# Patient Record
Sex: Female | Born: 1970 | Race: White | Hispanic: No | Marital: Single | Smoking: Current every day smoker
Health system: Southern US, Community
[De-identification: ages and names within clinical notes are randomized; demographics above are authoritative.]

## PROBLEM LIST (undated history)

## (undated) DIAGNOSIS — R51 Headache: Secondary | ICD-10-CM

## (undated) DIAGNOSIS — F419 Anxiety disorder, unspecified: Secondary | ICD-10-CM

## (undated) DIAGNOSIS — F329 Major depressive disorder, single episode, unspecified: Secondary | ICD-10-CM

## (undated) DIAGNOSIS — F32A Depression, unspecified: Secondary | ICD-10-CM

## (undated) HISTORY — PX: ABDOMINAL HYSTERECTOMY: SHX81

## (undated) HISTORY — PX: HERNIA REPAIR: SHX51

## (undated) HISTORY — DX: Major depressive disorder, single episode, unspecified: F32.9

## (undated) HISTORY — DX: Depression, unspecified: F32.A

## (undated) HISTORY — DX: Anxiety disorder, unspecified: F41.9

## (undated) HISTORY — DX: Headache: R51

---

## 1998-08-02 ENCOUNTER — Other Ambulatory Visit: Admission: RE | Admit: 1998-08-02 | Discharge: 1998-08-02 | Payer: Self-pay | Admitting: Obstetrics and Gynecology

## 1999-11-28 ENCOUNTER — Other Ambulatory Visit: Admission: RE | Admit: 1999-11-28 | Discharge: 1999-11-28 | Payer: Self-pay | Admitting: Gynecology

## 2000-05-19 ENCOUNTER — Encounter: Payer: Self-pay | Admitting: Orthopedic Surgery

## 2000-05-19 ENCOUNTER — Ambulatory Visit (HOSPITAL_COMMUNITY): Admission: RE | Admit: 2000-05-19 | Discharge: 2000-05-19 | Payer: Self-pay | Admitting: Orthopedic Surgery

## 2000-06-16 ENCOUNTER — Encounter: Admission: RE | Admit: 2000-06-16 | Discharge: 2000-06-16 | Payer: Self-pay | Admitting: Orthopedic Surgery

## 2000-06-16 ENCOUNTER — Encounter: Payer: Self-pay | Admitting: Orthopedic Surgery

## 2000-06-23 ENCOUNTER — Encounter: Admission: RE | Admit: 2000-06-23 | Discharge: 2000-06-23 | Payer: Self-pay | Admitting: Obstetrics & Gynecology

## 2000-06-23 ENCOUNTER — Encounter: Payer: Self-pay | Admitting: Obstetrics & Gynecology

## 2001-01-06 ENCOUNTER — Other Ambulatory Visit: Admission: RE | Admit: 2001-01-06 | Discharge: 2001-01-06 | Payer: Self-pay | Admitting: Obstetrics and Gynecology

## 2001-07-07 ENCOUNTER — Other Ambulatory Visit: Admission: RE | Admit: 2001-07-07 | Discharge: 2001-07-07 | Payer: Self-pay | Admitting: Obstetrics and Gynecology

## 2001-09-01 ENCOUNTER — Inpatient Hospital Stay (HOSPITAL_COMMUNITY): Admission: RE | Admit: 2001-09-01 | Discharge: 2001-09-04 | Payer: Self-pay | Admitting: Obstetrics and Gynecology

## 2001-09-18 ENCOUNTER — Encounter: Admission: RE | Admit: 2001-09-18 | Discharge: 2001-09-18 | Payer: Self-pay | Admitting: Surgery

## 2001-09-18 ENCOUNTER — Encounter: Payer: Self-pay | Admitting: Surgery

## 2002-04-09 ENCOUNTER — Encounter: Payer: Self-pay | Admitting: Surgery

## 2002-04-09 ENCOUNTER — Encounter: Admission: RE | Admit: 2002-04-09 | Discharge: 2002-04-09 | Payer: Self-pay | Admitting: Surgery

## 2003-01-07 ENCOUNTER — Other Ambulatory Visit: Admission: RE | Admit: 2003-01-07 | Discharge: 2003-01-07 | Payer: Self-pay | Admitting: Obstetrics and Gynecology

## 2003-01-11 ENCOUNTER — Encounter: Admission: RE | Admit: 2003-01-11 | Discharge: 2003-01-11 | Payer: Self-pay | Admitting: Obstetrics and Gynecology

## 2003-07-31 ENCOUNTER — Emergency Department (HOSPITAL_COMMUNITY): Admission: EM | Admit: 2003-07-31 | Discharge: 2003-07-31 | Payer: Self-pay | Admitting: Emergency Medicine

## 2004-06-04 ENCOUNTER — Emergency Department (HOSPITAL_COMMUNITY): Admission: EM | Admit: 2004-06-04 | Discharge: 2004-06-04 | Payer: Self-pay | Admitting: Family Medicine

## 2005-03-26 ENCOUNTER — Encounter: Admission: RE | Admit: 2005-03-26 | Discharge: 2005-03-26 | Payer: Self-pay | Admitting: Surgery

## 2005-06-06 ENCOUNTER — Ambulatory Visit: Payer: Self-pay | Admitting: Family Medicine

## 2006-04-24 ENCOUNTER — Ambulatory Visit: Payer: Self-pay | Admitting: Family Medicine

## 2006-07-22 ENCOUNTER — Emergency Department (HOSPITAL_COMMUNITY): Admission: EM | Admit: 2006-07-22 | Discharge: 2006-07-22 | Payer: Self-pay | Admitting: Emergency Medicine

## 2006-07-31 ENCOUNTER — Ambulatory Visit: Payer: Self-pay | Admitting: Family Medicine

## 2006-09-18 DIAGNOSIS — R519 Headache, unspecified: Secondary | ICD-10-CM | POA: Insufficient documentation

## 2006-09-18 DIAGNOSIS — F411 Generalized anxiety disorder: Secondary | ICD-10-CM

## 2006-09-18 DIAGNOSIS — R51 Headache: Secondary | ICD-10-CM | POA: Insufficient documentation

## 2007-06-05 ENCOUNTER — Ambulatory Visit: Payer: Self-pay | Admitting: Family Medicine

## 2007-06-05 DIAGNOSIS — R002 Palpitations: Secondary | ICD-10-CM | POA: Insufficient documentation

## 2007-06-08 LAB — CONVERTED CEMR LAB
AST: 16 units/L (ref 0–37)
Alkaline Phosphatase: 64 units/L (ref 39–117)
BUN: 15 mg/dL (ref 6–23)
Basophils Absolute: 0.1 10*3/uL (ref 0.0–0.1)
Chloride: 104 meq/L (ref 96–112)
Eosinophils Absolute: 0.2 10*3/uL (ref 0.0–0.7)
Eosinophils Relative: 4.3 % (ref 0.0–5.0)
GFR calc non Af Amer: 101 mL/min
Lymphocytes Relative: 36.3 % (ref 12.0–46.0)
MCV: 92.4 fL (ref 78.0–100.0)
Neutrophils Relative %: 53.3 % (ref 43.0–77.0)
Platelets: 237 10*3/uL (ref 150–400)
Potassium: 4 meq/L (ref 3.5–5.1)
Total Bilirubin: 0.9 mg/dL (ref 0.3–1.2)
WBC: 5.7 10*3/uL (ref 4.5–10.5)

## 2007-06-19 ENCOUNTER — Encounter: Payer: Self-pay | Admitting: Family Medicine

## 2007-06-19 ENCOUNTER — Ambulatory Visit: Payer: Self-pay

## 2007-08-10 IMAGING — CT CT ABDOMEN W/ CM
1 of 3 series · 14 of 32 positions shown, 19 images · IV contrast (REDICAT/H2O & OMNIPQUE [ID])
Comparison: 04/09/02.

CLINICAL DATA: Abdominal pain.  Question recurrent hernia or tear from prior MESH placement. 
CT ABDOMEN AND PELVIS WITH CONTRAST:
TECHNIQUE: Multidetector CT imaging of the abdomen and pelvis was performed following the standard protocol during bolus administration of intravenous contrast.
Contrast:  566cc Omnipaque 300.

[Series 2: — · axial · 0.70mm/px · z∈[-381,-61]mm · 14 of 74 slices shown, 19 images]
[im 5/74  soft-tissue]
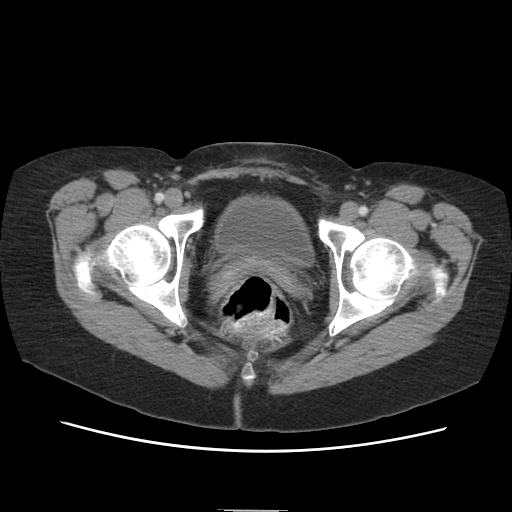
[im 5/74  bone]
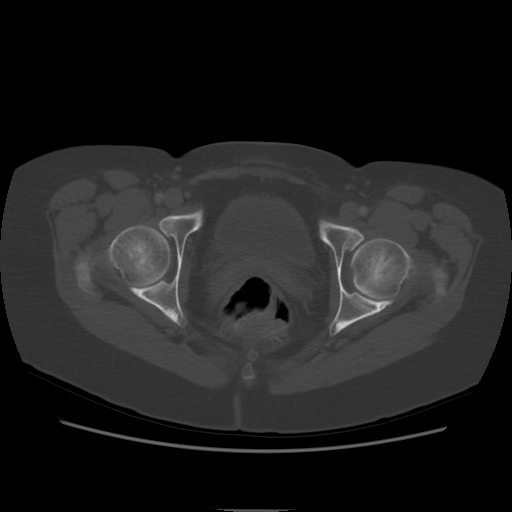
[im 9/74  soft-tissue]
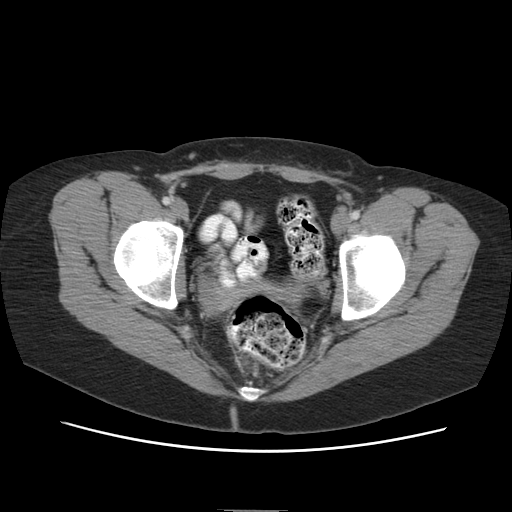
[im 18/74  soft-tissue]
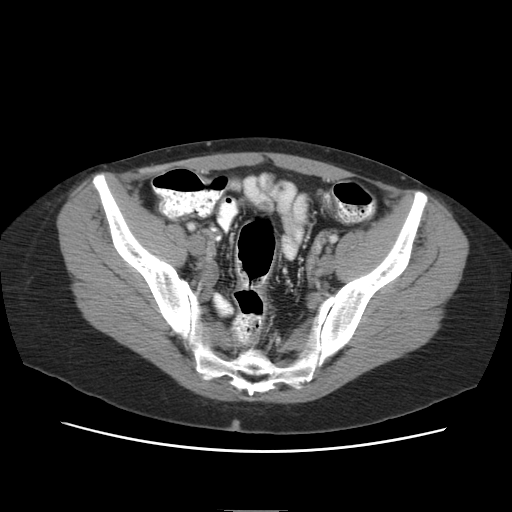
[im 22/74  soft-tissue]
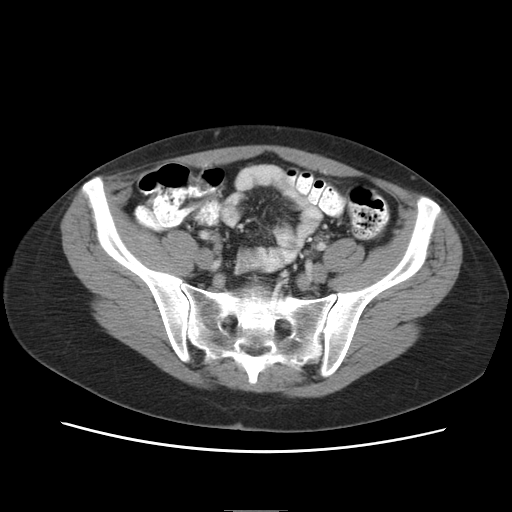
[im 26/74  soft-tissue]
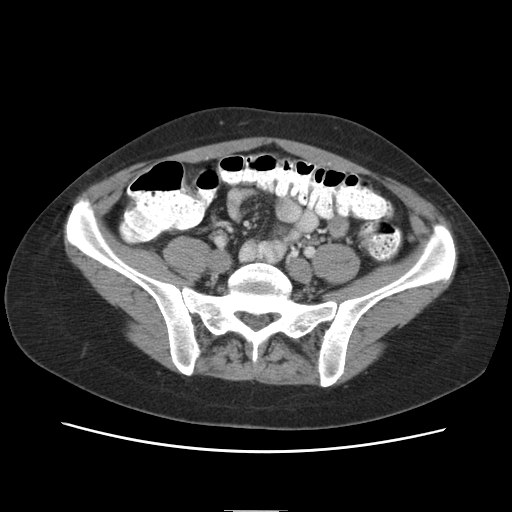
[im 31/74  soft-tissue]
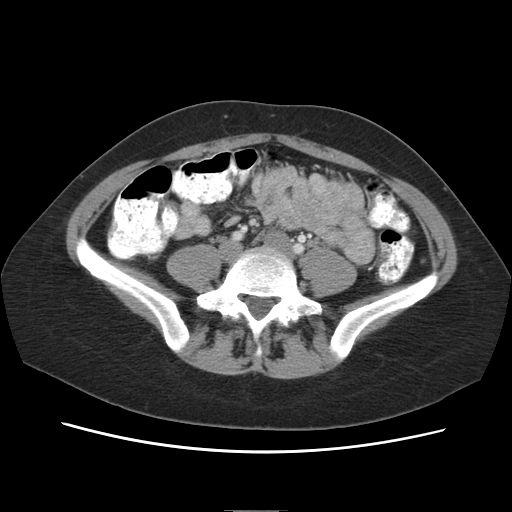
[im 39/74  soft-tissue]
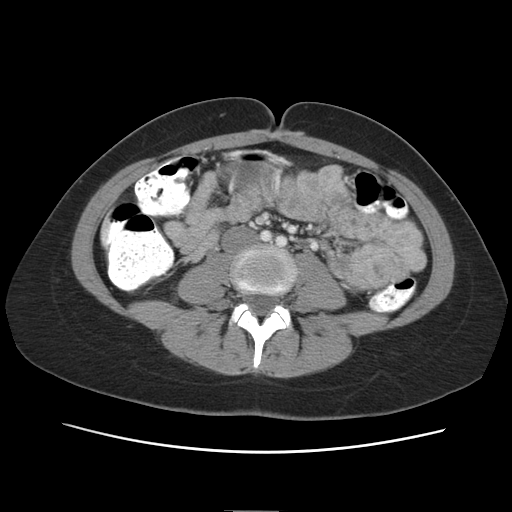
[im 43/74  soft-tissue]
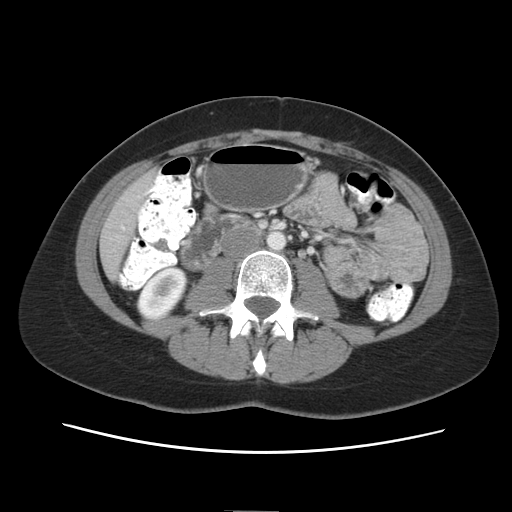
[im 48/74  soft-tissue]
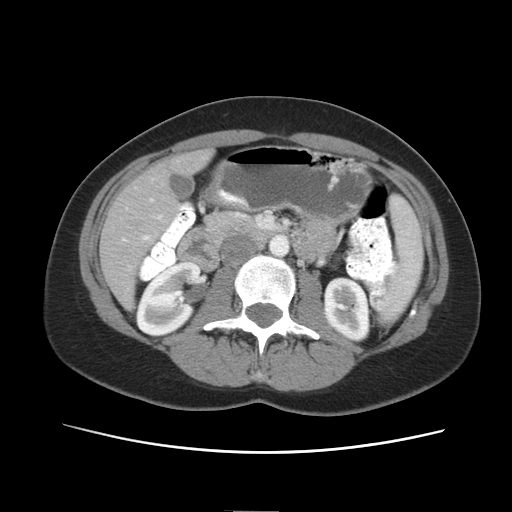
[im 48/74  bone]
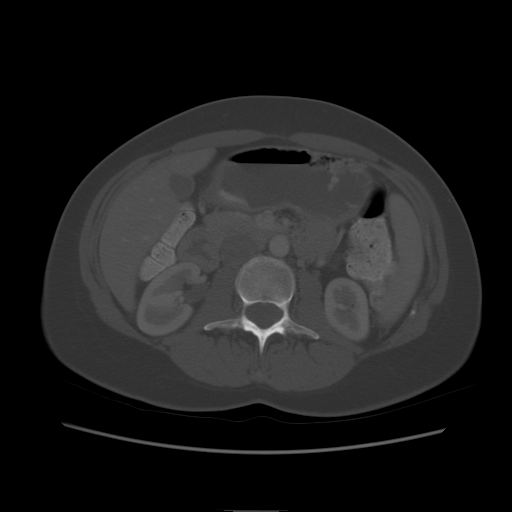
[im 52/74  soft-tissue]
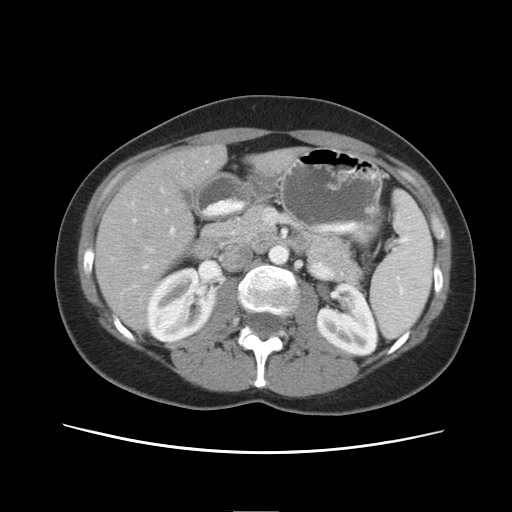
[im 56/74  soft-tissue]
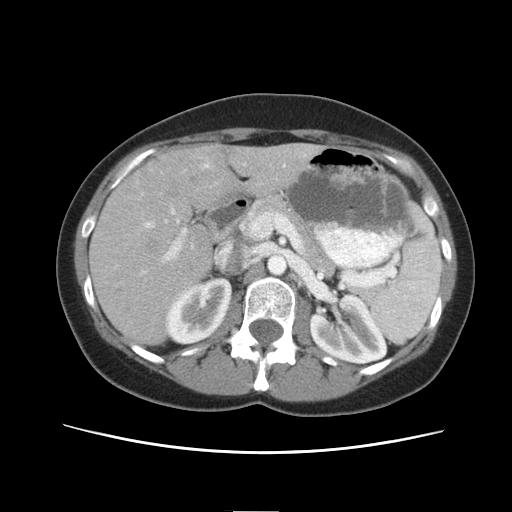
[im 56/74  lung]
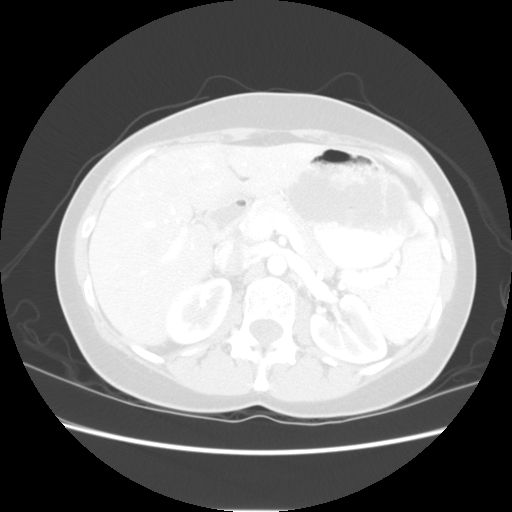
[im 61/74  lung]
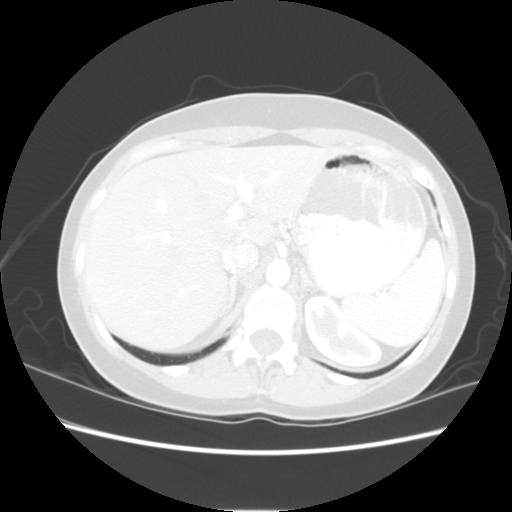
[im 65/74  soft-tissue]
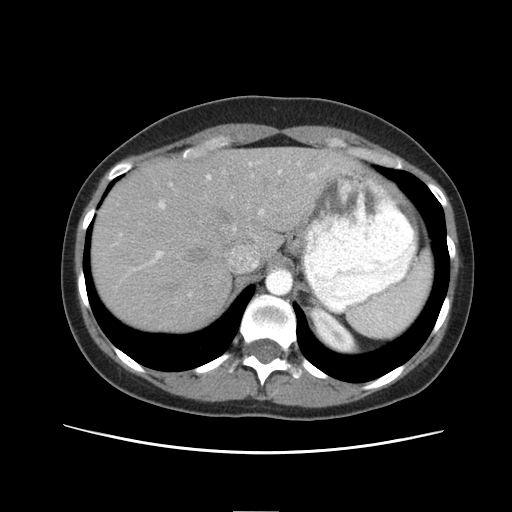
[im 65/74  lung]
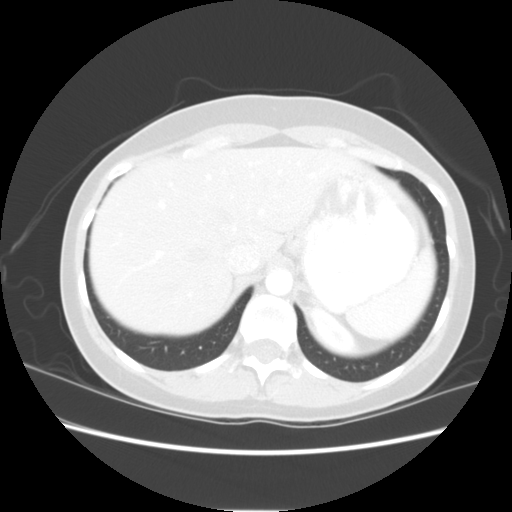
[im 69/74  soft-tissue]
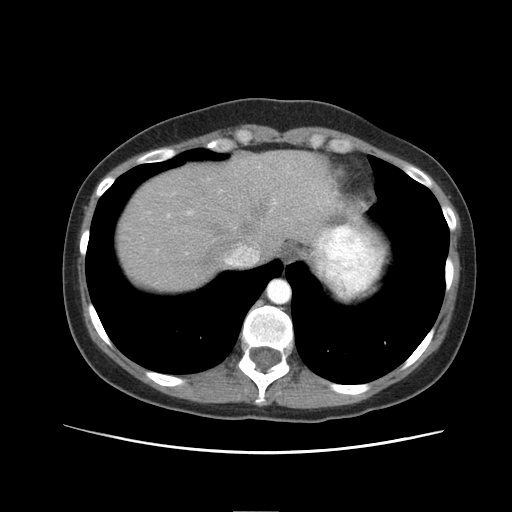
[im 69/74  lung]
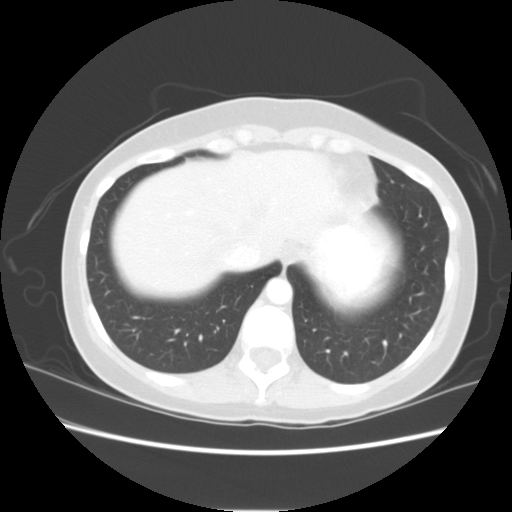

[14 of 32 positions shown; findings below may reference images not displayed]

CT ABDOMEN WITH CONTRAST:
No focal abnormality is seen in the liver or spleen.  The stomach, duodenum, pancreas, gallbladder, adrenal glands, and kidneys have normal features.  No free fluid or adenopathy.  Abdominal bowel loops are unremarkable.
IMPRESSION: No acute findings in the abdomen. 
CT PELVIS WITH CONTRAST:
No evidence for free fluid or lymphadenopathy in the anatomic pelvis.  17mm dominant follicle noted in the right ovary.  Left ovary is also unremarkable.  Patient is status post hysterectomy.  Terminal ileum and appendix have normal features.  
Linear high attenuation in the right inguinal region suggests prior hernia repair.  There is no evidence for recurrent hernia.  There is no adjacent edema or inflammation.  Similarly, there is linear high attenuation at the umbilicus suggesting that the patient may have had a prior umbilical hernia repair as well.  There is no evidence for recurrent fascial defect or hernia at this location.  No adjacent fluid collection or edema.
IMPRESSION: 1.  No acute findings.  There is no evidence for recurrent hernia at the umbilicus or at the level of the right inguinal region.  
2.  No CT evidence to explain this patient?s history of pain.

## 2007-11-05 ENCOUNTER — Ambulatory Visit: Payer: Self-pay | Admitting: Family Medicine

## 2009-04-19 ENCOUNTER — Ambulatory Visit: Payer: Self-pay | Admitting: Family Medicine

## 2009-04-19 DIAGNOSIS — F4321 Adjustment disorder with depressed mood: Secondary | ICD-10-CM | POA: Insufficient documentation

## 2009-05-03 ENCOUNTER — Ambulatory Visit: Payer: Self-pay | Admitting: Family Medicine

## 2009-05-03 DIAGNOSIS — F329 Major depressive disorder, single episode, unspecified: Secondary | ICD-10-CM

## 2009-08-03 ENCOUNTER — Ambulatory Visit: Payer: Self-pay | Admitting: Family Medicine

## 2010-03-06 NOTE — Assessment & Plan Note (Signed)
Summary: pt would like something for her nerves due to death in family...   Vital Signs:  Patient profile:   40 year old female Weight:      149 pounds BP sitting:   110 / 82  (left arm) Cuff size:   regular  Vitals Entered By: Raechel Ache, RN (April 19, 2009 3:57 PM) CC: Having trouble dealing with death of boyfriend- can't eat or sleep.   History of Present Illness: Here for help with depression and anxiety after the death of her boyfriend one week ago. She had met him in [place], and he was planning to move here to be with her. Last week as he was driving there to pack up he was killed in a MVA. She has been very upset ever since, tearful, can't eat or sleep, and has had trouble doing her work at her job. She has never been treated for stress before.   Allergies: 1)  ! Levaquin  Past History:  Past Medical History: Reviewed history from 09/18/2006 and no changes required. Anxiety Headache  Review of Systems  The patient denies anorexia, fever, weight loss, weight gain, vision loss, decreased hearing, hoarseness, chest pain, syncope, dyspnea on exertion, peripheral edema, prolonged cough, headaches, hemoptysis, abdominal pain, melena, hematochezia, severe indigestion/heartburn, hematuria, incontinence, genital sores, muscle weakness, suspicious skin lesions, transient blindness, difficulty walking, unusual weight change, abnormal bleeding, enlarged lymph nodes, angioedema, breast masses, and testicular masses.    Physical Exam  General:  Well-developed,well-nourished,in no acute distress; alert,appropriate and cooperative throughout examination Psych:  Oriented X3, memory intact for recent and remote, normally interactive, good eye contact, depressed affect, and tearful.     Impression & Recommendations:  Problem # 1:  DEPRESSION, SITUATIONAL (ICD-309.0)  Complete Medication List: 1)  Ibuprofen 600 Mg Tabs (Ibuprofen) .Marland Kitchen.. 1 by mouth once daily as needed 2)   Alprazolam 0.5 Mg Tabs (Alprazolam) .Marland Kitchen.. 1 6 hours as needed anxiety 3)  Celexa 20 Mg Tabs (Citalopram hydrobromide) .... Once daily  Patient Instructions: 1)  We spent 30 minutes discussing this situation and how to help her deal with it. Start on Celexa and Xanax. I offered to set her up with a therapist but she wants to think about this first. 2)  Please schedule a follow-up appointment in 2 weeks.  Prescriptions: CELEXA 20 MG TABS (CITALOPRAM HYDROBROMIDE) once daily  #30 x 2   Entered and Authorized by:   Nelwyn Salisbury MD   Signed by:   Nelwyn Salisbury MD on 04/19/2009   Method used:   Print then Give to Patient   RxID:   [id number] ALPRAZOLAM 0.5 MG TABS (ALPRAZOLAM) 1 6 hours as needed anxiety  #60 x 2   Entered and Authorized by:   Nelwyn Salisbury MD   Signed by:   Nelwyn Salisbury MD on 04/19/2009   Method used:   Print then Give to Patient   RxID:   [id number]

## 2010-03-06 NOTE — Assessment & Plan Note (Signed)
Summary: med check/refill/pt coming in fasting/? inj needed for work/p...   Vital Signs:  Patient profile:   40 year old female Weight:      143 pounds BP sitting:   110 / 72  (left arm) Cuff size:   regular  Vitals Entered By: Raechel Ache, RN (August 03, 2009 10:02 AM) CC: Med check and dog bite Sunday R leg- needs Tetanus.   History of Present Illness: Here to follow up on depression and to check a dog bite. Her depression has been well controlled, and she is very pleased with how Celexa is helping her. She sleeps well, is happy at work, and is doing well in general. However 4 days ago she stopped to help a dog that had been struck by a car driving ahead of her, and the dog bit her on the right lower leg. He did have a collar and appeared to be up to date on his shots. She has been cleaning the bite daily and wrapping it. The area is still fairly sore and drains a clear fluid. No fever or streaking.   Allergies: 1)  ! Levaquin  Past History:  Past Medical History: Reviewed history from 05/03/2009 and no changes required. Anxiety Headache Depression  Review of Systems  The patient denies anorexia, fever, weight loss, weight gain, vision loss, decreased hearing, hoarseness, chest pain, syncope, dyspnea on exertion, peripheral edema, prolonged cough, headaches, hemoptysis, abdominal pain, melena, hematochezia, severe indigestion/heartburn, hematuria, incontinence, genital sores, muscle weakness, suspicious skin lesions, transient blindness, difficulty walking, unusual weight change, abnormal bleeding, enlarged lymph nodes, angioedema, breast masses, and testicular masses.    Physical Exam  General:  Well-developed,well-nourished,in no acute distress; alert,appropriate and cooperative throughout examination Extremities:  the right lower leg has several small puncture wounds surrounded ny a small zone of erythema and ecchymosis. It is not draining, and it is mildly tender.  Psych:   Cognition and judgment appear intact. Alert and cooperative with normal attention span and concentration. No apparent delusions, illusions, hallucinations   Impression & Recommendations:  Problem # 1:  DEPRESSION (ICD-311)  Her updated medication list for this problem includes:    Alprazolam 0.5 Mg Tabs (Alprazolam) .Marland Kitchen... 1 6 hours as needed anxiety    Celexa 40 Mg Tabs (Citalopram hydrobromide) ..... Once daily  Problem # 2:  DOG BITE (ICD-E906.0)  Complete Medication List: 1)  Ibuprofen 600 Mg Tabs (Ibuprofen) .Marland Kitchen.. 1 by mouth once daily as needed 2)  Alprazolam 0.5 Mg Tabs (Alprazolam) .Marland Kitchen.. 1 6 hours as needed anxiety 3)  Celexa 40 Mg Tabs (Citalopram hydrobromide) .... Once daily 4)  Keflex 500 Mg Caps (Cephalexin) .... Three times a day  Other Orders: Tdap => 70yrs IM (19147) Admin 1st Vaccine (82956)  Patient Instructions: 1)  Continue on Celexa, and recheck in 6 months. treat the bite with Keflex and a Tdap.  Prescriptions: CELEXA 40 MG TABS (CITALOPRAM HYDROBROMIDE) once daily  #30 x 11   Entered and Authorized by:   Nelwyn Salisbury MD   Signed by:   Nelwyn Salisbury MD on 08/03/2009   Method used:   Print then Give to Patient   RxID:   [id number] ALPRAZOLAM 0.5 MG TABS (ALPRAZOLAM) 1 6 hours as needed anxiety  #30 x 5   Entered and Authorized by:   Nelwyn Salisbury MD   Signed by:   Nelwyn Salisbury MD on 08/03/2009   Method used:   Print then Give to Patient   RxID:  [id number] KEFLEX 500 MG CAPS (CEPHALEXIN) three times a day  #21 x 0   Entered and Authorized by:   Nelwyn Salisbury MD   Signed by:   Nelwyn Salisbury MD on 08/03/2009   Method used:   Print then Give to Patient   RxID:   [id number]    Immunizations Administered:  Tetanus Vaccine:    Vaccine Type: Tdap    Site: right deltoid    Mfr: [employer]oSmith[employer]    Dose: 0.5 ml    Route: IM    Given by: Raechel Ache, RN    Exp. Date: 04/28/2011    Lot #: [id number]    VIS given: 12/23/06 version  given August 03, 2009.

## 2010-03-06 NOTE — Assessment & Plan Note (Signed)
Summary: 2 wk rov/njr   Vital Signs:  Patient profile:   40 year old female Weight:      150 pounds BP sitting:   120 / 80  (left arm) Cuff size:   regular  Vitals Entered By: Raechel Ache, RN (May 03, 2009 3:54 PM) CC: 2 week f/u; doing better.   History of Present Illness: Here to follow up on depression after the recent death of her boyfriend. She has been taking celexa 20 mg a day, and this has been helpful for her. She has ben back to work, and although she still has "bad days" she does not get overwhelmed like before. She can talk to her friends and family now without breaking down into tears uncontrollably like before. She is sleeping reasonably well. Using Xanax primarily at night.   Allergies: 1)  ! Levaquin  Past History:  Past Medical History: Anxiety Headache Depression  Review of Systems  The patient denies anorexia, fever, weight loss, weight gain, vision loss, decreased hearing, hoarseness, chest pain, syncope, dyspnea on exertion, peripheral edema, prolonged cough, headaches, hemoptysis, abdominal pain, melena, hematochezia, severe indigestion/heartburn, hematuria, incontinence, genital sores, muscle weakness, suspicious skin lesions, transient blindness, difficulty walking, unusual weight change, abnormal bleeding, enlarged lymph nodes, angioedema, breast masses, and testicular masses.    Physical Exam  General:  Well-developed,well-nourished,in no acute distress; alert,appropriate and cooperative throughout examination Psych:  Cognition and judgment appear intact. Alert and cooperative with normal attention span and concentration. No apparent delusions, illusions, hallucinations   Impression & Recommendations:  Problem # 1:  DEPRESSION, SITUATIONAL (ICD-309.0)  Complete Medication List: 1)  Ibuprofen 600 Mg Tabs (Ibuprofen) .Marland Kitchen.. 1 by mouth once daily as needed 2)  Alprazolam 0.5 Mg Tabs (Alprazolam) .Marland Kitchen.. 1 6 hours as needed anxiety 3)  Celexa 40 Mg  Tabs (Citalopram hydrobromide) .... Once daily  Patient Instructions: 1)  She seems to be doing much better. We will increase the Celexa to 40 mg a day. use Xanax as needed . 2)  Please schedule a follow-up appointment in 1 month.  Prescriptions: CELEXA 40 MG TABS (CITALOPRAM HYDROBROMIDE) once daily  #30 x 5   Entered and Authorized by:   Nelwyn Salisbury MD   Signed by:   Nelwyn Salisbury MD on 05/03/2009   Method used:   Print then Give to Patient   RxID:   [id number]

## 2010-08-01 ENCOUNTER — Other Ambulatory Visit: Payer: Self-pay | Admitting: Family Medicine

## 2010-08-02 ENCOUNTER — Other Ambulatory Visit: Payer: Self-pay | Admitting: Family Medicine

## 2010-08-03 NOTE — Telephone Encounter (Signed)
Call in #60 with 5 rf 

## 2010-10-11 ENCOUNTER — Telehealth: Payer: Self-pay | Admitting: Family Medicine

## 2010-10-11 NOTE — Telephone Encounter (Signed)
Pt requesting refill on Celexa 40mg   [facility]

## 2010-10-12 MED ORDER — CITALOPRAM HYDROBROMIDE 40 MG PO TABS: ORAL_TABLET | ORAL | Status: DC

## 2010-10-12 NOTE — Telephone Encounter (Signed)
Call in #30 only. She needs an OV after that

## 2010-10-12 NOTE — Telephone Encounter (Signed)
rx sent to pharmacy

## 2010-10-12 NOTE — Telephone Encounter (Signed)
Pt last seen 08/03/09; pls advise.

## 2010-10-29 ENCOUNTER — Telehealth: Payer: Self-pay | Admitting: Family Medicine

## 2010-10-29 NOTE — Telephone Encounter (Signed)
I called pharmacy and the script was filled and then put back into stock, no one came to pick it up. They will refill for pt. I tried to call pt but no phone service.

## 2010-10-29 NOTE — Telephone Encounter (Signed)
Prescription was called in for [facility] on 10/12/10 pt when to pick it up [facility] said [facility] had nothing. Pt is requesting to have prescription resent for citalopram (CELEXA) 40 MG tablet  Please contact pt when resent.

## 2011-02-25 ENCOUNTER — Encounter: Payer: Self-pay | Admitting: Family Medicine

## 2011-02-26 ENCOUNTER — Ambulatory Visit (INDEPENDENT_AMBULATORY_CARE_PROVIDER_SITE_OTHER): Payer: Self-pay | Admitting: Family Medicine

## 2011-02-26 DIAGNOSIS — J069 Acute upper respiratory infection, unspecified: Secondary | ICD-10-CM

## 2011-03-01 NOTE — Progress Notes (Signed)
  Subjective:    Patient ID: April Kirk, female    DOB: Jul 17, 1970, 41 y.o.   MRN: [id number]  HPI This visit was cancelled   Review of Systems     Objective:   Physical Exam        Assessment & Plan:

## 2011-03-05 ENCOUNTER — Ambulatory Visit: Payer: Self-pay | Admitting: Family Medicine

## 2011-03-05 DIAGNOSIS — Z0289 Encounter for other administrative examinations: Secondary | ICD-10-CM

## 2013-09-01 ENCOUNTER — Encounter (HOSPITAL_COMMUNITY): Payer: Self-pay | Admitting: Emergency Medicine

## 2013-09-01 ENCOUNTER — Emergency Department (HOSPITAL_COMMUNITY)
Admission: EM | Admit: 2013-09-01 | Discharge: 2013-09-01 | Disposition: A | Discharge status: Home / Self Care | Payer: BC Managed Care – PPO | Attending: Emergency Medicine | Admitting: Emergency Medicine

## 2013-09-01 ENCOUNTER — Emergency Department (HOSPITAL_COMMUNITY): Payer: BC Managed Care – PPO

## 2013-09-01 DIAGNOSIS — S0990XA Unspecified injury of head, initial encounter: Secondary | ICD-10-CM | POA: Insufficient documentation

## 2013-09-01 DIAGNOSIS — F329 Major depressive disorder, single episode, unspecified: Secondary | ICD-10-CM | POA: Insufficient documentation

## 2013-09-01 DIAGNOSIS — S0083XA Contusion of other part of head, initial encounter: Principal | ICD-10-CM | POA: Insufficient documentation

## 2013-09-01 DIAGNOSIS — Z79899 Other long term (current) drug therapy: Secondary | ICD-10-CM | POA: Insufficient documentation

## 2013-09-01 DIAGNOSIS — F411 Generalized anxiety disorder: Secondary | ICD-10-CM | POA: Insufficient documentation

## 2013-09-01 DIAGNOSIS — F3289 Other specified depressive episodes: Secondary | ICD-10-CM | POA: Insufficient documentation

## 2013-09-01 DIAGNOSIS — R110 Nausea: Secondary | ICD-10-CM | POA: Insufficient documentation

## 2013-09-01 DIAGNOSIS — S0502XA Injury of conjunctiva and corneal abrasion without foreign body, left eye, initial encounter: Secondary | ICD-10-CM

## 2013-09-01 DIAGNOSIS — S0003XA Contusion of scalp, initial encounter: Secondary | ICD-10-CM

## 2013-09-01 DIAGNOSIS — S058X9A Other injuries of unspecified eye and orbit, initial encounter: Secondary | ICD-10-CM | POA: Insufficient documentation

## 2013-09-01 DIAGNOSIS — S301XXA Contusion of abdominal wall, initial encounter: Secondary | ICD-10-CM | POA: Insufficient documentation

## 2013-09-01 DIAGNOSIS — S1093XA Contusion of unspecified part of neck, initial encounter: Principal | ICD-10-CM

## 2013-09-01 DIAGNOSIS — S3013XA Contusion of flank (latus) region, initial encounter: Secondary | ICD-10-CM

## 2013-09-01 LAB — URINALYSIS, ROUTINE W REFLEX MICROSCOPIC
Bilirubin Urine: NEGATIVE
GLUCOSE, UA: NEGATIVE mg/dL
KETONES UR: 40 mg/dL — AB
Leukocytes, UA: NEGATIVE
Nitrite: NEGATIVE
PROTEIN: 100 mg/dL — AB
Specific Gravity, Urine: >1.03 — ABNORMAL HIGH (ref 1.005–1.030)
Urobilinogen, UA: 0.2 mg/dL (ref 0.0–1.0)
pH: 6 (ref 5.0–8.0)

## 2013-09-01 LAB — URINE MICROSCOPIC-ADD ON

## 2013-09-01 MED ORDER — IBUPROFEN 800 MG PO TABS
800.0000 mg | ORAL_TABLET | Freq: Once | ORAL | Status: AC
  Administered 2013-09-01: 800 mg via ORAL
  Filled 2013-09-01: qty 1

## 2013-09-01 MED ORDER — FLUORESCEIN-BENOXINATE 0.25-0.4 % OP SOLN
1.0000 [drp] | Freq: Once | OPHTHALMIC | Status: DC
  Filled 2013-09-01: qty 5

## 2013-09-01 MED ORDER — TETRACAINE HCL 0.5 % OP SOLN
OPHTHALMIC | Status: AC
  Filled 2013-09-01: qty 2

## 2013-09-01 MED ORDER — FLUORESCEIN SODIUM 1 MG OP STRP
ORAL_STRIP | OPHTHALMIC | Status: AC
  Administered 2013-09-01: 02:00:00
  Filled 2013-09-01: qty 1

## 2013-09-01 MED ORDER — TRAMADOL HCL 50 MG PO TABS: 50.0000 mg | ORAL_TABLET | Freq: Four times a day (QID) | ORAL | Status: DC | PRN

## 2013-09-01 MED ORDER — TRAMADOL HCL 50 MG PO TABS
50.0000 mg | ORAL_TABLET | Freq: Once | ORAL | Status: AC
  Administered 2013-09-01: 50 mg via ORAL
  Filled 2013-09-01: qty 1

## 2013-09-01 MED ORDER — SULFACETAMIDE SODIUM 10 % OP SOLN: 2.0000 [drp] | OPHTHALMIC | Status: DC

## 2013-09-01 MED ORDER — ONDANSETRON 8 MG PO TBDP
8.0000 mg | ORAL_TABLET | Freq: Once | ORAL | Status: AC
  Administered 2013-09-01: 8 mg via ORAL
  Filled 2013-09-01: qty 1
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
## 2013-09-01 NOTE — ED Notes (Signed)
EDP at bedside  

## 2013-09-01 NOTE — ED Notes (Signed)
Patient verbalizes understanding of discharge instructions, prescription medications, home care, follow up care and return to ED for worsening or change in condition. Patient is ambulatory out of department at this time escorted by [employer] and patients family member.
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
## 2014-02-21 ENCOUNTER — Telehealth: Payer: Self-pay | Admitting: Family Medicine

## 2014-02-21 NOTE — Telephone Encounter (Signed)
Pt would like to est with dr fry. Pt last seen 2013. [place]

## 2014-02-23 NOTE — Telephone Encounter (Signed)
Yes I can see her again  

## 2014-02-23 NOTE — Telephone Encounter (Signed)
Pt has been sch 03-15-14

## 2014-03-15 ENCOUNTER — Encounter: Payer: Self-pay | Admitting: Family Medicine

## 2014-03-15 ENCOUNTER — Ambulatory Visit (INDEPENDENT_AMBULATORY_CARE_PROVIDER_SITE_OTHER): Payer: BLUE CROSS/BLUE SHIELD | Admitting: Family Medicine

## 2014-03-15 VITALS — BP 128/83 | HR 76 | Temp 98.3°F | Ht 63.0 in | Wt 149.0 lb

## 2014-03-15 DIAGNOSIS — F418 Other specified anxiety disorders: Secondary | ICD-10-CM

## 2014-03-15 LAB — CBC WITH DIFFERENTIAL/PLATELET
Basophils Absolute: 0 10*3/uL (ref 0.0–0.1)
Basophils Relative: 0.3 % (ref 0.0–3.0)
EOS ABS: 0 10*3/uL (ref 0.0–0.7)
EOS PCT: 0.3 % (ref 0.0–5.0)
HEMATOCRIT: 36.4 % (ref 36.0–46.0)
Hemoglobin: 12.4 g/dL (ref 12.0–15.0)
LYMPHS PCT: 24.5 % (ref 12.0–46.0)
Lymphs Abs: 1.5 10*3/uL (ref 0.7–4.0)
MCHC: 34 g/dL (ref 30.0–36.0)
MCV: 90.6 fl (ref 78.0–100.0)
MONOS PCT: 4.5 % (ref 3.0–12.0)
Monocytes Absolute: 0.3 10*3/uL (ref 0.1–1.0)
Neutro Abs: 4.4 10*3/uL (ref 1.4–7.7)
Neutrophils Relative %: 70.4 % (ref 43.0–77.0)
PLATELETS: 209 10*3/uL (ref 150.0–400.0)
RBC: 4.01 Mil/uL (ref 3.87–5.11)
RDW: 13.5 % (ref 11.5–15.5)
WBC: 6.2 10*3/uL (ref 4.0–10.5)

## 2014-03-15 LAB — POCT URINALYSIS DIPSTICK
BILIRUBIN UA: NEGATIVE
GLUCOSE UA: NEGATIVE
Ketones, UA: NEGATIVE
LEUKOCYTES UA: NEGATIVE
NITRITE UA: NEGATIVE
Protein, UA: NEGATIVE
Spec Grav, UA: 1.02
Urobilinogen, UA: 0.2
pH, UA: 6.5

## 2014-03-15 LAB — BASIC METABOLIC PANEL
BUN: 19 mg/dL (ref 6–23)
CO2: 27 mEq/L (ref 19–32)
Calcium: 9.1 mg/dL (ref 8.4–10.5)
Chloride: 108 mEq/L (ref 96–112)
Creatinine, Ser: 0.74 mg/dL (ref 0.40–1.20)
GFR: 91.03 mL/min (ref >60.00)
GLUCOSE: 82 mg/dL (ref 70–99)
POTASSIUM: 4.6 meq/L (ref 3.5–5.1)
SODIUM: 139 meq/L (ref 135–145)

## 2014-03-15 LAB — HEPATIC FUNCTION PANEL
ALT: 10 U/L (ref 0–35)
AST: 13 U/L (ref 0–37)
Albumin: 4.1 g/dL (ref 3.5–5.2)
Alkaline Phosphatase: 67 U/L (ref 39–117)
BILIRUBIN TOTAL: 0.8 mg/dL (ref 0.2–1.2)
Bilirubin, Direct: 0.1 mg/dL (ref 0.0–0.3)
Total Protein: 6.6 g/dL (ref 6.0–8.3)

## 2014-03-15 LAB — TSH: TSH: 2.66 u[IU]/mL (ref 0.35–4.50)

## 2014-03-15 MED ORDER — CITALOPRAM HYDROBROMIDE 40 MG PO TABS: 40.0000 mg | ORAL_TABLET | Freq: Every day | ORAL | Status: DC

## 2014-03-15 MED ORDER — ALPRAZOLAM 0.5 MG PO TABS: 0.5000 mg | ORAL_TABLET | Freq: Four times a day (QID) | ORAL | Status: DC | PRN

## 2014-03-15 NOTE — Progress Notes (Signed)
Pre visit review using our clinic review tool, if applicable. No additional management support is needed unless otherwise documented below in the visit note. 

## 2014-03-15 NOTE — Progress Notes (Signed)
   Subjective:    Patient ID: April Kirk, female    DOB: 12/23/1970, 44 y.o.   MRN: [id number]  HPI 44 yr old female here to re-establish after an absence of 3 years. She lost her job and her health insurance so she did not come in to refill her medications 2 years ago, and she tried going without the Celexa and the Xanax. She did okay for a time but now she is struggling with depression, anxiety, and irritability. She would like to get back on the same regimen as before. She is now working as a IT trainercompounding pharmacy technician.    Review of Systems  Constitutional: Positive for fatigue. Negative for appetite change and unexpected weight change.  Respiratory: Negative.   Cardiovascular: Negative.   Endocrine: Negative.   Neurological: Negative.   Psychiatric/Behavioral: Positive for dysphoric mood and decreased concentration. Negative for confusion and agitation. The patient is nervous/anxious.        Objective:   Physical Exam  Constitutional: She is oriented to person, place, and time. She appears well-developed and well-nourished.  Neck: No thyromegaly present.  Cardiovascular: Normal rate, regular rhythm, normal heart sounds and intact distal pulses.   Pulmonary/Chest: Effort normal and breath sounds normal.  Lymphadenopathy:    She has no cervical adenopathy.  Neurological: She is alert and oriented to person, place, and time.  Psychiatric: She has a normal mood and affect. Her behavior is normal. Thought content normal.          Assessment & Plan:  We will get her back on Celexa and Xanax again. Get labs today

## 2014-03-16 ENCOUNTER — Telehealth: Payer: Self-pay | Admitting: Family Medicine

## 2014-03-16 NOTE — Telephone Encounter (Signed)
emmi mailed  °

## 2014-08-17 ENCOUNTER — Other Ambulatory Visit: Payer: Self-pay | Admitting: Family Medicine

## 2014-09-12 ENCOUNTER — Other Ambulatory Visit: Payer: Self-pay | Admitting: Family Medicine

## 2014-09-12 NOTE — Telephone Encounter (Signed)
Ok to refill for one month  

## 2014-09-12 NOTE — Telephone Encounter (Signed)
Pt was last here on 03/15/14 and due for refills.

## 2014-10-18 ENCOUNTER — Other Ambulatory Visit: Payer: Self-pay | Admitting: Adult Health

## 2014-10-20 NOTE — Telephone Encounter (Signed)
Call in #60 with 5 rf 

## 2015-03-16 ENCOUNTER — Other Ambulatory Visit: Payer: Self-pay | Admitting: Family Medicine

## 2015-03-16 NOTE — Telephone Encounter (Signed)
Ok to refill 

## 2015-03-17 NOTE — Telephone Encounter (Signed)
Call in #60 with no rf. She needs an OV soon 

## 2015-09-19 ENCOUNTER — Other Ambulatory Visit: Payer: Self-pay | Admitting: Family Medicine
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
## 2016-01-25 ENCOUNTER — Ambulatory Visit: Payer: BLUE CROSS/BLUE SHIELD | Admitting: Family Medicine

## 2016-01-25 ENCOUNTER — Telehealth: Payer: Self-pay | Admitting: Family Medicine

## 2016-01-25 DIAGNOSIS — Z0289 Encounter for other administrative examinations: Secondary | ICD-10-CM

## 2016-01-25 NOTE — Telephone Encounter (Signed)
Pt was on schedule to see Dr. Clent RidgesFry today. I tried to call and advise pt to reschedule, no answer.

## 2016-02-26 ENCOUNTER — Ambulatory Visit (INDEPENDENT_AMBULATORY_CARE_PROVIDER_SITE_OTHER): Payer: BLUE CROSS/BLUE SHIELD | Admitting: Family Medicine

## 2016-02-26 ENCOUNTER — Encounter: Payer: Self-pay | Admitting: Family Medicine

## 2016-02-26 VITALS — BP 108/82 | HR 70 | Temp 98.8°F | Ht 63.0 in | Wt 155.0 lb

## 2016-02-26 DIAGNOSIS — F411 Generalized anxiety disorder: Secondary | ICD-10-CM

## 2016-02-26 DIAGNOSIS — Z23 Encounter for immunization: Secondary | ICD-10-CM

## 2016-02-26 MED ORDER — CITALOPRAM HYDROBROMIDE 40 MG PO TABS: ORAL_TABLET | ORAL | 5 refills | Status: DC

## 2016-02-26 MED ORDER — ALPRAZOLAM 0.5 MG PO TABS: ORAL_TABLET | ORAL | 2 refills | Status: DC

## 2016-02-26 NOTE — Addendum Note (Signed)
Addended by: Aniceto BossNIMMONS, SYLVIA A on: 02/26/2016 12:06 PM   Modules accepted: Orders
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
## 2016-02-26 NOTE — Progress Notes (Signed)
Pre visit review using our clinic review tool, if applicable. No additional management support is needed unless otherwise documented below in the visit note. 

## 2016-03-06 ENCOUNTER — Other Ambulatory Visit (INDEPENDENT_AMBULATORY_CARE_PROVIDER_SITE_OTHER): Payer: BLUE CROSS/BLUE SHIELD

## 2016-03-06 DIAGNOSIS — Z0000 Encounter for general adult medical examination without abnormal findings: Secondary | ICD-10-CM | POA: Diagnosis not present

## 2016-03-06 LAB — BASIC METABOLIC PANEL
BUN: 18 mg/dL (ref 6–23)
CHLORIDE: 109 meq/L (ref 96–112)
CO2: 29 meq/L (ref 19–32)
Calcium: 8.9 mg/dL (ref 8.4–10.5)
Creatinine, Ser: 0.71 mg/dL (ref 0.40–1.20)
GFR: 94.62 mL/min (ref >60.00)
Glucose, Bld: 90 mg/dL (ref 70–99)
POTASSIUM: 4.1 meq/L (ref 3.5–5.1)
Sodium: 142 mEq/L (ref 135–145)

## 2016-03-06 LAB — POC URINALSYSI DIPSTICK (AUTOMATED)
Bilirubin, UA: NEGATIVE
Glucose, UA: NEGATIVE
Ketones, UA: NEGATIVE
Leukocytes, UA: NEGATIVE
Nitrite, UA: NEGATIVE
PROTEIN UA: NEGATIVE
RBC UA: NEGATIVE
SPEC GRAV UA: 1.02
UROBILINOGEN UA: 0.2
pH, UA: 7

## 2016-03-06 LAB — CBC WITH DIFFERENTIAL/PLATELET
BASOS PCT: 0.6 % (ref 0.0–3.0)
Basophils Absolute: 0.1 10*3/uL (ref 0.0–0.1)
Eosinophils Absolute: 0.2 10*3/uL (ref 0.0–0.7)
Eosinophils Relative: 1.8 % (ref 0.0–5.0)
HEMATOCRIT: 38.4 % (ref 36.0–46.0)
HEMOGLOBIN: 13 g/dL (ref 12.0–15.0)
LYMPHS PCT: 16.8 % (ref 12.0–46.0)
Lymphs Abs: 1.5 10*3/uL (ref 0.7–4.0)
MCHC: 34 g/dL (ref 30.0–36.0)
MCV: 91.9 fl (ref 78.0–100.0)
Monocytes Absolute: 0.2 10*3/uL (ref 0.1–1.0)
Monocytes Relative: 2.7 % — ABNORMAL LOW (ref 3.0–12.0)
NEUTROS ABS: 6.8 10*3/uL (ref 1.4–7.7)
Neutrophils Relative %: 78.1 % — ABNORMAL HIGH (ref 43.0–77.0)
PLATELETS: 254 10*3/uL (ref 150.0–400.0)
RBC: 4.17 Mil/uL (ref 3.87–5.11)
RDW: 13.3 % (ref 11.5–15.5)
WBC: 8.7 10*3/uL (ref 4.0–10.5)

## 2016-03-06 LAB — HEPATIC FUNCTION PANEL
ALT: 9 U/L (ref 0–35)
AST: 10 U/L (ref 0–37)
Albumin: 4 g/dL (ref 3.5–5.2)
Alkaline Phosphatase: 63 U/L (ref 39–117)
BILIRUBIN DIRECT: 0.1 mg/dL (ref 0.0–0.3)
TOTAL PROTEIN: 6.4 g/dL (ref 6.0–8.3)
Total Bilirubin: 0.9 mg/dL (ref 0.2–1.2)

## 2016-03-06 LAB — LIPID PANEL
CHOL/HDL RATIO: 5
Cholesterol: 208 mg/dL — ABNORMAL HIGH (ref 0–200)
HDL: 40.3 mg/dL (ref >39.00)
LDL Cholesterol: 147 mg/dL — ABNORMAL HIGH (ref 0–99)
NONHDL: 167.76
TRIGLYCERIDES: 104 mg/dL (ref 0.0–149.0)
VLDL: 20.8 mg/dL (ref 0.0–40.0)

## 2016-03-06 LAB — TSH: TSH: 1.34 u[IU]/mL (ref 0.35–4.50)

## 2016-03-13 ENCOUNTER — Encounter: Payer: Self-pay | Admitting: Family Medicine

## 2016-03-13 ENCOUNTER — Ambulatory Visit (INDEPENDENT_AMBULATORY_CARE_PROVIDER_SITE_OTHER): Payer: BLUE CROSS/BLUE SHIELD | Admitting: Family Medicine

## 2016-03-13 VITALS — BP 127/86 | HR 63 | Temp 98.8°F | Ht 63.0 in | Wt 158.0 lb

## 2016-03-13 DIAGNOSIS — Z0000 Encounter for general adult medical examination without abnormal findings: Secondary | ICD-10-CM | POA: Diagnosis not present

## 2016-03-13 NOTE — Progress Notes (Signed)
   Subjective:    Patient ID: April Kirk, female    DOB: 12/24/1970, 46 y.o.   MRN: [id number]  HPI 46 yr old female for a well exam. She feels great physically but she is dealing with some anxiety. We talked to her a few weeks ago and she started on Celexa. This has been helpful but it is not fully therapeutic as yet.    Review of Systems  Constitutional: Negative.   HENT: Negative.   Eyes: Negative.   Respiratory: Negative.   Cardiovascular: Negative.   Gastrointestinal: Negative.   Genitourinary: Negative for decreased urine volume, difficulty urinating, dyspareunia, dysuria, enuresis, flank pain, frequency, hematuria, pelvic pain and urgency.  Musculoskeletal: Negative.   Skin: Negative.   Neurological: Negative.   Psychiatric/Behavioral: Positive for sleep disturbance. Negative for agitation, behavioral problems, confusion, decreased concentration, dysphoric mood, hallucinations, self-injury and suicidal ideas. The patient is nervous/anxious. The patient is not hyperactive.        Objective:   Physical Exam  Constitutional: She is oriented to person, place, and time. She appears well-developed and well-nourished. No distress.  HENT:  Head: Normocephalic and atraumatic.  Right Ear: External ear normal.  Left Ear: External ear normal.  Nose: Nose normal.  Mouth/Throat: Oropharynx is clear and moist. No oropharyngeal exudate.  Eyes: Conjunctivae and EOM are normal. Pupils are equal, round, and reactive to light. No scleral icterus.  Neck: Normal range of motion. Neck supple. No JVD present. No thyromegaly present.  Cardiovascular: Normal rate, regular rhythm, normal heart sounds and intact distal pulses.  Exam reveals no gallop and no friction rub.   No murmur heard. Pulmonary/Chest: Effort normal and breath sounds normal. No respiratory distress. She has no wheezes. She has no rales. She exhibits no tenderness.  Abdominal: Soft. Bowel sounds are normal. She exhibits no  distension and no mass. There is no tenderness. There is no rebound and no guarding.  Musculoskeletal: Normal range of motion. She exhibits no edema or tenderness.  Lymphadenopathy:    She has no cervical adenopathy.  Neurological: She is alert and oriented to person, place, and time. She has normal reflexes. No cranial nerve deficit. She exhibits normal muscle tone. Coordination normal.  Skin: Skin is warm and dry. No rash noted. No erythema.  Psychiatric: She has a normal mood and affect. Her behavior is normal. Judgment and thought content normal.          Assessment & Plan:  Well exam. We discussed diet and exercise. She will follow up on the anxiety as scheduled.  Gershon CraneStephen Chan Rosasco, MD

## 2016-03-13 NOTE — Progress Notes (Signed)
Pre visit review using our clinic review tool, if applicable. No additional management support is needed unless otherwise documented below in the visit note. 

## 2016-05-24 ENCOUNTER — Other Ambulatory Visit: Payer: Self-pay | Admitting: Family Medicine

## 2016-05-24 NOTE — Telephone Encounter (Signed)
Call in #60 with 5 rf (she had an OV in February)

## 2016-05-27 NOTE — Telephone Encounter (Signed)
Rx was called in to patient pharmacy.

## 2016-09-13 ENCOUNTER — Other Ambulatory Visit: Payer: Self-pay | Admitting: Family Medicine

## 2016-12-09 ENCOUNTER — Other Ambulatory Visit: Payer: Self-pay | Admitting: Family Medicine

## 2016-12-10 NOTE — Telephone Encounter (Signed)
Call in #60 with 5 rf 

## 2016-12-11 NOTE — Telephone Encounter (Signed)
Rx was called in for pt.  

## 2017-03-24 ENCOUNTER — Inpatient Hospital Stay
Admit: 2017-03-24 | Discharge: 2017-03-24 | Disposition: A | Discharge status: Home / Self Care | Payer: Self-pay | Attending: Emergency Medicine

## 2017-03-24 DIAGNOSIS — R0981 Nasal congestion: Secondary | ICD-10-CM

## 2017-03-24 LAB — METABOLIC PANEL, COMPREHENSIVE
A-G Ratio: 1 — ABNORMAL LOW (ref 1.1–2.2)
ALT (SGPT): 25 U/L (ref 12–78)
AST (SGOT): 19 U/L (ref 15–37)
Albumin: 3.4 g/dL — ABNORMAL LOW (ref 3.5–5.0)
Alk. phosphatase: 84 U/L (ref 45–117)
Anion gap: 13 mmol/L (ref 5–15)
BUN/Creatinine ratio: 15 (ref 12–20)
BUN: 11 MG/DL (ref 6–20)
Bilirubin, total: 0.5 MG/DL (ref 0.2–1.0)
CO2: 25 mmol/L (ref 21–32)
Calcium: 8.3 MG/DL — ABNORMAL LOW (ref 8.5–10.1)
Chloride: 100 mmol/L (ref 97–108)
Creatinine: 0.72 MG/DL (ref 0.55–1.02)
GFR est AA: >60 mL/min/{1.73_m2} (ref >60)
GFR est non-AA: >60 mL/min/{1.73_m2} (ref >60)
Globulin: 3.3 g/dL (ref 2.0–4.0)
Glucose: 77 mg/dL (ref 65–100)
Potassium: 3.5 mmol/L (ref 3.5–5.1)
Protein, total: 6.7 g/dL (ref 6.4–8.2)
Sodium: 138 mmol/L (ref 136–145)

## 2017-03-24 LAB — CBC WITH AUTOMATED DIFF
ABS. BASOPHILS: 0.1 10*3/uL (ref 0.0–0.1)
ABS. EOSINOPHILS: 0 10*3/uL (ref 0.0–0.4)
ABS. IMM. GRANS.: 0 10*3/uL (ref 0.00–0.04)
ABS. LYMPHOCYTES: 0.6 10*3/uL — ABNORMAL LOW (ref 0.8–3.5)
ABS. MONOCYTES: 0.4 10*3/uL (ref 0.0–1.0)
ABS. NEUTROPHILS: 4.4 10*3/uL (ref 1.8–8.0)
ABSOLUTE NRBC: 0 10*3/uL (ref 0.00–0.01)
BASOPHILS: 1 % (ref 0–1)
EOSINOPHILS: 0 % (ref 0–7)
HCT: 38 % (ref 35.0–47.0)
HGB: 12.8 g/dL (ref 11.5–16.0)
IMMATURE GRANULOCYTES: 0 % (ref 0.0–0.5)
LYMPHOCYTES: 11 % — ABNORMAL LOW (ref 12–49)
MCH: 31.1 PG (ref 26.0–34.0)
MCHC: 33.7 g/dL (ref 30.0–36.5)
MCV: 92.5 FL (ref 80.0–99.0)
MONOCYTES: 7 % (ref 5–13)
MPV: 10.1 FL (ref 8.9–12.9)
NEUTROPHILS: 81 % — ABNORMAL HIGH (ref 32–75)
NRBC: 0 PER 100 WBC
PLATELET: 189 10*3/uL (ref 150–400)
RBC: 4.11 M/uL (ref 3.80–5.20)
RDW: 13.2 % (ref 11.5–14.5)
WBC: 5.5 10*3/uL (ref 3.6–11.0)

## 2017-03-24 LAB — INFLUENZA A & B AG (RAPID TEST)
Influenza A Antigen: NEGATIVE
Influenza B Antigen: NEGATIVE

## 2017-03-24 LAB — SAMPLES BEING HELD

## 2017-03-24 MED ORDER — AMOXICILLIN CLAVULANATE 875 MG-125 MG TAB
875-125 mg | ORAL_TABLET | Freq: Two times a day (BID) | ORAL | 0 refills | Status: AC
Start: 2017-03-24 — End: 2017-04-03

## 2017-03-24 MED ORDER — SODIUM CHLORIDE 0.9% BOLUS IV
0.9 % | Freq: Once | INTRAVENOUS | Status: AC
Start: 2017-03-24 — End: 2017-03-24
  Administered 2017-03-24: 18:00:00 via INTRAVENOUS

## 2017-03-24 MED FILL — SODIUM CHLORIDE 0.9 % IV: INTRAVENOUS | Qty: 1000

## 2017-03-24 NOTE — ED Triage Notes (Signed)
Pt started yesterday with sinus, ear, head pain, fever.  Took 800mg  ibuprofen wtihout relief.  Also tried Aleve, and then had n/v/d yesterday, but none today.

## 2017-03-24 NOTE — ED Notes (Signed)
Other than reduced facial pain, no other changes in pt status.  Reviewed d/c instructions, pt verbalized understanding.  Ambulated out with steady gait.

## 2017-03-24 NOTE — ED Provider Notes (Signed)
Pt. Presents to the ER with complaints of cough and chills.  Pt. Began to feel poorly yesterday.  Her sx began with congestion and cough.  Pt. Complains of headache and right ear fullness.  Pt. Took 800mg  of ibuprofen x2 but only 4 hours apart.  Since then, he has had some upper abdominal cramping.  No know fevers. Pt. Had n/v/d yesterday, but she has not vomited or had diarrhea today. Pt. Has had nausea today.  No CP.  No changes with her urine.  No known sick contacts.             No past medical history on file.    Past Surgical History:   Procedure Laterality Date   ??? HX GI      hernia repair   ??? HX GYN      hysterectomy         No family history on file.    Social History     Socioeconomic History   ??? Marital status: Not on file     Spouse name: Not on file   ??? Number of children: Not on file   ??? Years of education: Not on file   ??? Highest education level: Not on file   Social Needs   ??? Financial resource strain: Not on file   ??? Food insecurity - worry: Not on file   ??? Food insecurity - inability: Not on file   ??? Transportation needs - medical: Not on file   ??? Transportation needs - non-medical: Not on file   Occupational History   ??? Not on file   Tobacco Use   ??? Smoking status: Current Every Day Smoker   ??? Smokeless tobacco: Never Used   Substance and Sexual Activity   ??? Alcohol use: No     Frequency: Never   ??? Drug use: No   ??? Sexual activity: Not on file   Other Topics Concern   ??? Not on file   Social History Narrative   ??? Not on file         ALLERGIES: Levaquin [levofloxacin]    Review of Systems   Constitutional: Positive for chills. Negative for fever.   HENT: Positive for ear pain. Negative for rhinorrhea and sore throat.    Respiratory: Positive for cough. Negative for shortness of breath.    Cardiovascular: Negative for chest pain.   Gastrointestinal: Positive for abdominal pain, diarrhea, nausea and vomiting.   Genitourinary: Negative for dysuria and urgency.    Musculoskeletal: Negative for arthralgias and back pain.   Skin: Negative for rash.   Neurological: Negative for dizziness, weakness and light-headedness.       Vitals:    03/24/17 1249   BP: 148/83   Pulse: 75   Resp: 16   Temp: 98.6 ??F (37 ??C)   SpO2: 99%   Weight: 75.6 kg (166 lb 10.7 oz)   Height: 5\' 3"  (1.6 m)            Physical Exam     Vital signs reviewed.  Nursing notes reviewed.    Const:  No acute distress, well developed, well nourished  Head:  Atraumatic, normocephalic  Eyes:  PERRL, conjunctiva normal, no scleral icterus  Neck:  Supple, trachea midline  Cardiovascular:  RRR, no murmurs, no gallops, no rubs  Resp:  No resp distress, no increased work of breathing, no wheezes, no rhonchi, no rales,  Abd:  Soft, minimal LUQ tenderness, non-distended, no rebound, no guarding, no  CVA tenderness  GU:  Deferred  MSK:  No pedal edema, normal ROM  Neuro:  Alert and oriented x3, no cranial nerve defect  Skin:  Warm, dry, intact  Psych: normal mood and affect, behavior is normal, judgement and thought content is normal          MDM  Number of Diagnoses or Management Options  Congestion of nasal sinus:      Amount and/or Complexity of Data Reviewed  Clinical lab tests: ordered and reviewed  Tests in the radiology section of CPT??: ordered and reviewed  Review and summarize past medical records: yes    Patient Progress  Patient progress: stable          Pt. Presents to the ER with complaints of nasal congestion.  Negative flu.  Pt. Is well appearing in the ER.  Possibly sinusitis.  I will start her on Augmentin.  Pt. To f/u with her PCP or return to the ER with worsening sx.      Procedures

## 2017-07-07 ENCOUNTER — Other Ambulatory Visit: Payer: Self-pay | Admitting: Family Medicine

## 2017-07-07 NOTE — Telephone Encounter (Signed)
Last OV 03/13/2016   Last refilled 12/11/2016 disp 60 with 5 refills   Pt is due for an OV

## 2017-10-01 ENCOUNTER — Other Ambulatory Visit: Payer: Self-pay | Admitting: Family Medicine

## 2018-01-12 ENCOUNTER — Encounter: Payer: BLUE CROSS/BLUE SHIELD | Admitting: Family Medicine

## 2018-01-12 DIAGNOSIS — Z0289 Encounter for other administrative examinations: Secondary | ICD-10-CM

## 2018-02-19 ENCOUNTER — Telehealth: Payer: Self-pay | Admitting: *Deleted

## 2018-02-19 NOTE — Telephone Encounter (Signed)
Copied from CRM [id number]. Topic: General - Other >> Feb 19, 2018 11:32 AM April Kirk wrote:  Pt ask if Dr Clent Ridges will except her son as a new pt he is having anxiety issues

## 2018-02-20 NOTE — Telephone Encounter (Signed)
Yes I can see him  ?

## 2018-02-20 NOTE — Telephone Encounter (Signed)
° ° °  Pt has been schedule for 02/27/2018

## 2018-07-02 ENCOUNTER — Encounter: Payer: Self-pay | Admitting: Family Medicine

## 2018-07-02 ENCOUNTER — Other Ambulatory Visit: Payer: Self-pay

## 2018-07-02 ENCOUNTER — Ambulatory Visit (INDEPENDENT_AMBULATORY_CARE_PROVIDER_SITE_OTHER): Payer: Self-pay | Admitting: Family Medicine

## 2018-07-02 DIAGNOSIS — F418 Other specified anxiety disorders: Secondary | ICD-10-CM

## 2018-07-02 NOTE — Progress Notes (Signed)
Subjective:    Patient ID: April Kirk, female    DOB: July 14, 1970, 48 y.o.   MRN: [id number]  HPI Virtual Visit via Video Note  I connected with the patient on 07/02/18 at  9:45 AM EDT by a video enabled telemedicine application and verified that I am speaking with the correct person using two identifiers.  Location patient: home Location provider:work or home office Persons participating in the virtual visit: patient, provider  I discussed the limitations of evaluation and management by telemedicine and the availability of in person appointments. The patient expressed understanding and agreed to proceed.   HPI: Here for refill son Celexa and Xanax. 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 Her anxiety had settled down somewhat last year and she took herself off these meds about 6 months ago. However lately the anxiety has been more of an issue. She has not had much depression fortunately. She is seeing a cardiologist for palpitations in the chest, and it is not clear if these are related to the anxiety or not. So far she has had a normal exam and EKG and ECHO. She is scheduled for a stress test soon.    ROS: See pertinent positives and negatives per HPI.  Past Medical History:  Diagnosis Date  . Anxiety   . Depression   . WCHJSCBI(377.9)     Past Surgical History:  Procedure Laterality Date  . ABDOMINAL HYSTERECTOMY    . HERNIA REPAIR      Family History  Problem Relation Age of Onset  . Arthritis Unknown   . Diabetes Unknown   . Hyperlipidemia Unknown   . Hypertension Unknown   . Cancer Unknown        lung,breast,cervical,colon,prostate  . Heart disease Unknown      Current Outpatient Medications:  .  ALPRAZolam (XANAX) 0.5 MG tablet, TAKE 1 TABLET BY MOUTH EVERY 6 HOURS AS NEEDED, Disp: 60 tablet, Rfl: 5 .  citalopram (CELEXA) 40 MG tablet, TAKE 1 TABLET BY MOUTH ONCE DAILY *NEEDS  APPOINTMENT   BEFORE  NEXT  REFILLS, Disp: 30 tablet, Rfl: 0 .  ibuprofen (ADVIL,MOTRIN) 600 MG tablet, Take 600 mg by mouth every 6 (six) hours as needed., Disp: , Rfl:   EXAM:  VITALS per patient if applicable:  GENERAL: alert, oriented, appears well and in no acute distress  HEENT: atraumatic, conjunttiva clear, no obvious abnormalities on inspection of external nose and ears  NECK: normal movements of the head and neck  LUNGS: on inspection no signs of respiratory distress, breathing rate appears normal, no obvious gross SOB, gasping or wheezing  CV: no obvious cyanosis  MS: moves all visible extremities without noticeable abnormality  PSYCH/NEURO: pleasant and cooperative, no obvious depression or anxiety, speech and thought processing grossly intact  ASSESSMENT AND PLAN: Anxiety with depression. We will get her back on the previous regimen of Celexa and Xanax. Recheck prn.  Gershon Crane, MD  Discussed the following assessment and plan:  No diagnosis found.     I discussed the assessment and treatment plan with the patient. The patient was provided an opportunity to ask questions and all were answered. The patient agreed with the plan and demonstrated an understanding of the instructions.   The patient was advised to call back or seek an in-person evaluation if the symptoms worsen or if the condition fails to improve as anticipated.     Review of Systems  Objective:   Physical Exam        Assessment & Plan:

## 2018-07-03 ENCOUNTER — Telehealth: Payer: Self-pay | Admitting: *Deleted

## 2018-07-03 NOTE — Telephone Encounter (Signed)
Pt calling to check the status of the Rx's Prudy Feeler and CELEXA) that she stated Dr. Clent Ridges said he would send in after her visit on yesterday with him.  Pt would like to have a call back when it has been called in please.

## 2018-07-03 NOTE — Telephone Encounter (Signed)
Copied from CRM [id number]. Topic: General - Inquiry >> Jul 02, 2018  4:15 PM Reggie Pile, [place] wrote: Reason for CRM: Patient is calling in about a medication that Dr.Fry stated he would prescribe and is checking in on the status.   The xanax and the celexa were not sent in from her visit with Dr. Clent Ridges on 5/28---Dr. Clent Ridges please advise if ok to send these to the pharmacy.  Thanks

## 2018-07-03 NOTE — Telephone Encounter (Signed)
Patient returning call about medication- She is requesting a call back before the office closes.

## 2018-07-03 NOTE — Telephone Encounter (Signed)
Dr. Fry please advise on refill of these meds.  Thanks  

## 2018-07-06 MED ORDER — ALPRAZOLAM 0.5 MG PO TABS: 0.5000 mg | ORAL_TABLET | Freq: Four times a day (QID) | ORAL | 5 refills | Status: DC | PRN

## 2018-07-06 MED ORDER — CITALOPRAM HYDROBROMIDE 40 MG PO TABS: ORAL_TABLET | ORAL | 5 refills | Status: DC

## 2018-07-06 NOTE — Telephone Encounter (Signed)
These were sent in  

## 2018-07-06 NOTE — Telephone Encounter (Signed)
Pt called and stated that this medication was sent to wrong pharmacy. Pt needs this sent to   [facility] [address] [place], [place] - [address] [phone] (Phone) [phone] (Fax)

## 2018-07-07 ENCOUNTER — Telehealth: Payer: Self-pay | Admitting: Family Medicine

## 2018-07-07 MED ORDER — CITALOPRAM HYDROBROMIDE 40 MG PO TABS: ORAL_TABLET | ORAL | 5 refills | Status: DC

## 2018-07-07 MED ORDER — ALPRAZOLAM 0.5 MG PO TABS: 0.5000 mg | ORAL_TABLET | Freq: Four times a day (QID) | ORAL | 5 refills | Status: DC | PRN

## 2018-07-07 NOTE — Addendum Note (Signed)
Addended by: Marcellus Scott on: 07/07/2018 01:57 PM   Modules accepted: Orders

## 2018-07-07 NOTE — Telephone Encounter (Signed)
I called the [facility] in [place] and did cancel these rx for the pt since they were sent to the wrong pharmacy.  I called and left these refills for these 2 meds on the VM at the [facility] in [place] per the pts request.

## 2018-07-07 NOTE — Telephone Encounter (Signed)
Copied from CRM [id number]. Topic: Quick Communication - Rx Refill/Question >> Jul 07, 2018 11:36 AM April Kirk wrote: CRM for notification. See Telephone encounter for: 07/07/18.citalopram (CELEXA) 40 MG tablet ALPRAZolam (XANAX) 0.5 MG tablet [facility] - [place], [place] - [address] [phone] (Phone) [phone] (Fax)  Pt  2 scrpits sent to [facility] in [place]  (error)and was supposed to go to the [facility] and but now are closed due to [employer].  No answer at either. Would you please send these to this [facility] in [place] so she can get her meds?

## 2018-07-08 NOTE — Telephone Encounter (Signed)
[facility] called to follow up on rx's being sent to them so pt can pick them up. Stated they still do not have citalopram (CELEXA) 40 MG tablet/ ALPRAZolam (XANAX) 0.5 MG tablet. Please advise

## 2018-07-09 MED ORDER — ALPRAZOLAM 0.5 MG PO TABS: 0.5000 mg | ORAL_TABLET | Freq: Four times a day (QID) | ORAL | 5 refills | Status: DC | PRN

## 2018-07-09 MED ORDER — CITALOPRAM HYDROBROMIDE 40 MG PO TABS: ORAL_TABLET | ORAL | 5 refills | Status: DC

## 2018-07-09 NOTE — Telephone Encounter (Signed)
These were sent to the new pharmacy

## 2019-01-13 ENCOUNTER — Other Ambulatory Visit: Payer: Self-pay | Admitting: Family Medicine

## 2019-01-13 NOTE — Telephone Encounter (Signed)
Okay for refill?  

## 2019-03-02 ENCOUNTER — Other Ambulatory Visit: Payer: Self-pay | Admitting: Family Medicine

## 2019-08-17 ENCOUNTER — Other Ambulatory Visit: Payer: Self-pay | Admitting: Family Medicine

## 2019-08-19 NOTE — Telephone Encounter (Signed)
Last filled 01/14/2019 Last OV 07/02/2018  Ok to fill?

## 2019-09-23 ENCOUNTER — Other Ambulatory Visit: Payer: Self-pay

## 2019-09-23 ENCOUNTER — Encounter: Payer: Self-pay | Admitting: Family Medicine

## 2019-09-23 ENCOUNTER — Telehealth (INDEPENDENT_AMBULATORY_CARE_PROVIDER_SITE_OTHER): Payer: Self-pay | Admitting: Family Medicine

## 2019-09-23 DIAGNOSIS — U071 COVID-19: Secondary | ICD-10-CM

## 2019-09-23 DIAGNOSIS — F418 Other specified anxiety disorders: Secondary | ICD-10-CM

## 2019-09-23 MED ORDER — CITALOPRAM HYDROBROMIDE 40 MG PO TABS: 40.0000 mg | ORAL_TABLET | Freq: Every day | ORAL | 11 refills | Status: DC

## 2019-09-23 MED ORDER — ALPRAZOLAM 0.5 MG PO TABS: 0.5000 mg | ORAL_TABLET | Freq: Four times a day (QID) | ORAL | 5 refills | Status: DC | PRN

## 2019-09-23 NOTE — Progress Notes (Signed)
   Subjective:    Patient ID: April Kirk, female    DOB: 1970-03-20, 49 y.o.   MRN: [id number]  HPI Virtual Visit via Telephone Note  I connected with the patient on 09/23/19 at 11:30 AM EDT by telephone and verified that I am speaking with the correct person using two identifiers.   I discussed the limitations, risks, security and privacy concerns of performing an evaluation and management service by telephone and the availability of in person appointments. I also discussed with the patient that there may be a patient responsible charge related to this service. The patient expressed understanding and agreed to proceed.  Location patient: home Location provider: work or home office Participants present for the call: patient, provider Patient did not have a visit in the prior 7 days to address this/these issue(s).   History of Present Illness: Here to follow up on anxiety and depression and she needs refills. Her medications have been working well for her. Also she tested positive for the Covid virus 4 days ago. She is now quarantining at home. She has had stuffy head, ST, headache, fever to 101 degrees, body aches, and a dry cough. No chest pain or SOB, no NVD. She is drinking fluids and taking Tylenol prn. She has received the first Covid vaccine but not the second one.   Observations/Objective: Patient sounds cheerful and well on the phone. I do not appreciate any SOB. Speech and thought processing are grossly intact. Patient reported vitals:  Assessment and Plan: For the Covid infection, she should recover well at home and she can follow up as needed. She will complete a 10 day quarantine. Once this is over she should get the second Covid vaccine, and she agrees. Her anxiety an depression are stable, and we will refer her Celexa and Xanax. Gershon Crane, MD   Follow Up Instructions:     [phone] 5-10 [phone] 11-20 [id number] 21-30 I did not refer this patient for an OV in the next 24  hours for this/these issue(s).  I discussed the assessment and treatment plan with the patient. The patient was provided an opportunity to ask questions and all were answered. The patient agreed with the plan and demonstrated an understanding of the instructions.   The patient was advised to call back or seek an in-person evaluation if the symptoms worsen or if the condition fails to improve as anticipated.  I provided 12 minutes of non-face-to-face time during this encounter.   Gershon Crane, MD    Review of Systems     Objective:   Physical Exam        Assessment & Plan:

## 2020-10-05 ENCOUNTER — Telehealth: Payer: Self-pay | Admitting: Family Medicine

## 2020-10-05 NOTE — Telephone Encounter (Signed)
PT called to advise that she needs a refill of their:  ALPRAZolam (XANAX) 0.5 MG tablet citalopram (CELEXA) 40 MG tablet  Called into the [facility] in [place], [place] on [address] PT is currently out of their meds for the last 2 weeks and state if they need to be seen please advise.

## 2020-10-05 NOTE — Telephone Encounter (Signed)
Last vv- 09/23/19 Last refill- 09/23/19  No future office visit scheduled.   Can this patient receive a refill?

## 2020-10-06 MED ORDER — ALPRAZOLAM 0.5 MG PO TABS: 0.5000 mg | ORAL_TABLET | Freq: Four times a day (QID) | ORAL | 0 refills | Status: DC | PRN

## 2020-10-06 MED ORDER — CITALOPRAM HYDROBROMIDE 40 MG PO TABS: 40.0000 mg | ORAL_TABLET | Freq: Every day | ORAL | 0 refills | Status: DC

## 2020-10-06 NOTE — Telephone Encounter (Signed)
Called pt to help schedule a f/u appointment per Dr Clent Ridges , no option of leaving a message

## 2020-10-06 NOTE — Telephone Encounter (Signed)
I sent in a ONE TIME refill but she will need an in person OV before any more can be sent

## 2020-10-06 NOTE — Addendum Note (Signed)
Addended by: Gershon Crane A on: 10/06/2020 07:59 AM   Modules accepted: Orders

## 2020-10-19 NOTE — Telephone Encounter (Signed)
Attempted to call several times regarding her refills, pt phone rings continuously not having any option to leave a message

## 2020-11-10 ENCOUNTER — Other Ambulatory Visit: Payer: Self-pay | Admitting: Family Medicine

## 2020-11-16 ENCOUNTER — Other Ambulatory Visit: Payer: Self-pay | Admitting: Family Medicine

## 2020-11-16 NOTE — Telephone Encounter (Signed)
Patient called to see why prescription had been refused and I let patient know it was due to needing an appointment. I asked if patient would like to schedule now but she refused and said she would call again later to schedule.

## 2020-11-17 NOTE — Telephone Encounter (Signed)
Noted. Thanks.

## 2020-11-21 ENCOUNTER — Encounter: Payer: Self-pay | Admitting: Family Medicine

## 2020-11-21 ENCOUNTER — Ambulatory Visit (INDEPENDENT_AMBULATORY_CARE_PROVIDER_SITE_OTHER): Payer: Managed Care, Other (non HMO) | Admitting: Family Medicine

## 2020-11-21 ENCOUNTER — Other Ambulatory Visit: Payer: Self-pay

## 2020-11-21 VITALS — BP 102/78 | HR 82 | Temp 98.5°F | Ht 63.0 in | Wt 151.0 lb

## 2020-11-21 DIAGNOSIS — F418 Other specified anxiety disorders: Secondary | ICD-10-CM | POA: Diagnosis not present

## 2020-11-21 MED ORDER — ALPRAZOLAM 0.5 MG PO TABS: 0.5000 mg | ORAL_TABLET | Freq: Four times a day (QID) | ORAL | 5 refills | Status: DC | PRN

## 2020-11-21 MED ORDER — CITALOPRAM HYDROBROMIDE 40 MG PO TABS: 40.0000 mg | ORAL_TABLET | Freq: Every day | ORAL | 3 refills | Status: DC

## 2020-11-21 NOTE — Progress Notes (Signed)
   Subjective:    Patient ID: April Kirk, female    DOB: 1970-04-06, 50 y.o.   MRN: [id number]  HPI Here to follow up on anxiety and depression. She has ben doing well on her current regimen, and she wants to continue with it. She thinks she is menopausal because she has been having daily hot flashes for about 6 months. She has not seen her GYN for about 5 years. Appetite and sleep are intact.    Review of Systems  Constitutional: Negative.   Respiratory: Negative.    Cardiovascular: Negative.   Psychiatric/Behavioral:  Negative for agitation, behavioral problems, confusion, decreased concentration, dysphoric mood, hallucinations, self-injury, sleep disturbance and suicidal ideas. The patient is not nervous/anxious.       Objective:   Physical Exam Constitutional:      Appearance: Normal appearance.  Cardiovascular:     Rate and Rhythm: Normal rate and regular rhythm.     Pulses: Normal pulses.     Heart sounds: Normal heart sounds.  Pulmonary:     Effort: Pulmonary effort is normal.     Breath sounds: Normal breath sounds.  Neurological:     Mental Status: She is alert.  Psychiatric:        Mood and Affect: Mood normal.        Behavior: Behavior normal.        Thought Content: Thought content normal.          Assessment & Plan:  Anxiety with depression, stable. We refilled Xanax and Celexa. She seems to be menopausal so she can try black cohosh. She will return soon for a complete exam and lab work. Gershon Crane, MD

## 2020-12-06 ENCOUNTER — Encounter: Payer: Managed Care, Other (non HMO) | Admitting: Family Medicine

## 2021-06-14 ENCOUNTER — Other Ambulatory Visit: Payer: Self-pay | Admitting: Family Medicine

## 2021-06-14 NOTE — Telephone Encounter (Signed)
Last refill- 11/21/20-60 tabs, 5 refills ?Last OV- 11/21/2020 ? ?No future OV scheduled.  ?

## 2021-06-15 NOTE — Telephone Encounter (Signed)
Pt is scheduled for OV on 5/1/ at 10a. Requests a refill of  ALPRAZolam (XANAX) 0.5 MG tablet  and citalopram (CELEXA) 40 MG tablet to hold her over until the appointment.  ? ? ?   ?[id number] [address], [place] Phone:  [phone]  ?Fax:  [phone]  ?  ? ? ?

## 2021-06-20 ENCOUNTER — Ambulatory Visit: Payer: Self-pay | Admitting: Family Medicine

## 2021-09-24 ENCOUNTER — Telehealth: Payer: Self-pay | Admitting: Family Medicine

## 2021-09-24 MED ORDER — CITALOPRAM HYDROBROMIDE 40 MG PO TABS: 40.0000 mg | ORAL_TABLET | Freq: Every day | ORAL | 3 refills | Status: DC

## 2021-09-24 MED ORDER — ALPRAZOLAM 0.5 MG PO TABS: 0.5000 mg | ORAL_TABLET | Freq: Four times a day (QID) | ORAL | 5 refills | Status: DC | PRN

## 2021-09-24 NOTE — Telephone Encounter (Signed)
Done

## 2021-09-24 NOTE — Telephone Encounter (Signed)
Last OV-12/01/20 Last refill-06/15/21-60 tabs, 5 refills.    1 tab q 6 hours.   No future OV scheduled.

## 2021-09-24 NOTE — Addendum Note (Signed)
Addended by: Gershon Crane A on: 09/24/2021 05:48 PM   Modules accepted: Orders

## 2021-09-24 NOTE — Telephone Encounter (Signed)
Requesting refill citalopram (CELEXA) 40 MG tablet and ALPRAZolam (XANAX) 0.5 MG tablet offered OV declined

## 2022-04-05 ENCOUNTER — Other Ambulatory Visit: Payer: Self-pay | Admitting: Family Medicine

## 2022-05-08 ENCOUNTER — Ambulatory Visit: Payer: Medicaid Other | Admitting: Family Medicine

## 2022-05-08 ENCOUNTER — Encounter: Payer: Self-pay | Admitting: Family Medicine

## 2022-05-08 VITALS — BP 136/94 | HR 84 | Temp 98.1°F | Wt 165.2 lb

## 2022-05-08 DIAGNOSIS — R635 Abnormal weight gain: Secondary | ICD-10-CM | POA: Diagnosis not present

## 2022-05-08 DIAGNOSIS — F418 Other specified anxiety disorders: Secondary | ICD-10-CM

## 2022-05-08 DIAGNOSIS — R5383 Other fatigue: Secondary | ICD-10-CM

## 2022-05-08 MED ORDER — CITALOPRAM HYDROBROMIDE 40 MG PO TABS: 40.0000 mg | ORAL_TABLET | Freq: Every day | ORAL | 3 refills | Status: DC

## 2022-05-08 MED ORDER — ALPRAZOLAM 0.5 MG PO TABS: 0.5000 mg | ORAL_TABLET | Freq: Four times a day (QID) | ORAL | 5 refills | Status: DC | PRN

## 2022-05-08 NOTE — Progress Notes (Signed)
   Subjective:    Patient ID: Danella Sensing, female    DOB: 09-Nov-1970, 52 y.o.   MRN: [id number]  HPI Here for medication refills and to discuss a range of symptoms. She has been out of Citalopram and Alprazolam for about 4 weeks. She has also spoken to [place] about menopausal symptoms like hot flashes, and she has tried taking black cohosh for these. This has not worked however. She also describes generalized fatigue, a weight gain of 20 lbs in the last 2 months, and difficulty with concentration.    Review of Systems  Constitutional:  Positive for fatigue and unexpected weight change.  Respiratory: Negative.    Cardiovascular: Negative.   Gastrointestinal: Negative.   Genitourinary: Negative.   Neurological: Negative.   Psychiatric/Behavioral:  Positive for decreased concentration. Negative for agitation, behavioral problems, confusion, dysphoric mood, hallucinations, self-injury, sleep disturbance and suicidal ideas. The patient is nervous/anxious.        Objective:   Physical Exam Constitutional:      Appearance: Normal appearance.  Cardiovascular:     Rate and Rhythm: Normal rate and regular rhythm.     Pulses: Normal pulses.     Heart sounds: Normal heart sounds.  Pulmonary:     Effort: Pulmonary effort is normal.     Breath sounds: Normal breath sounds.  Neurological:     General: No focal deficit present.     Mental Status: She is alert and oriented to person, place, and time.           Assessment & Plan:  Her anxiety and depression are stable. We will refill the Citalopram and the Alprazolam. For the other symptoms, she will set up a well exam with fasting labs sometime soon.  Alysia Penna, MD

## 2022-05-13 ENCOUNTER — Ambulatory Visit (INDEPENDENT_AMBULATORY_CARE_PROVIDER_SITE_OTHER): Payer: Medicaid Other | Admitting: Family Medicine

## 2022-05-13 ENCOUNTER — Encounter: Payer: Self-pay | Admitting: Family Medicine

## 2022-05-13 VITALS — BP 126/82 | HR 78 | Temp 98.4°F | Ht 63.0 in | Wt 167.0 lb

## 2022-05-13 DIAGNOSIS — F418 Other specified anxiety disorders: Secondary | ICD-10-CM | POA: Diagnosis not present

## 2022-05-13 DIAGNOSIS — E785 Hyperlipidemia, unspecified: Secondary | ICD-10-CM

## 2022-05-13 DIAGNOSIS — Z23 Encounter for immunization: Secondary | ICD-10-CM | POA: Diagnosis not present

## 2022-05-13 DIAGNOSIS — E559 Vitamin D deficiency, unspecified: Secondary | ICD-10-CM | POA: Diagnosis not present

## 2022-05-13 DIAGNOSIS — R739 Hyperglycemia, unspecified: Secondary | ICD-10-CM | POA: Diagnosis not present

## 2022-05-13 DIAGNOSIS — F900 Attention-deficit hyperactivity disorder, predominantly inattentive type: Secondary | ICD-10-CM

## 2022-05-13 DIAGNOSIS — R222 Localized swelling, mass and lump, trunk: Secondary | ICD-10-CM

## 2022-05-13 LAB — HEPATIC FUNCTION PANEL
ALT: 16 U/L (ref 0–35)
AST: 18 U/L (ref 0–37)
Albumin: 4.3 g/dL (ref 3.5–5.2)
Alkaline Phosphatase: 116 U/L (ref 39–117)
Bilirubin, Direct: 0.1 mg/dL (ref 0.0–0.3)
Total Bilirubin: 0.9 mg/dL (ref 0.2–1.2)
Total Protein: 7.1 g/dL (ref 6.0–8.3)

## 2022-05-13 LAB — CBC WITH DIFFERENTIAL/PLATELET
Basophils Absolute: 0 10*3/uL (ref 0.0–0.1)
Basophils Relative: 0.6 % (ref 0.0–3.0)
Eosinophils Absolute: 0.2 10*3/uL (ref 0.0–0.7)
Eosinophils Relative: 3.2 % (ref 0.0–5.0)
HCT: 39.8 % (ref 36.0–46.0)
Hemoglobin: 13.7 g/dL (ref 12.0–15.0)
Lymphocytes Relative: 30.9 % (ref 12.0–46.0)
Lymphs Abs: 1.7 10*3/uL (ref 0.7–4.0)
MCHC: 34.4 g/dL (ref 30.0–36.0)
MCV: 88.9 fl (ref 78.0–100.0)
Monocytes Absolute: 0.3 10*3/uL (ref 0.1–1.0)
Monocytes Relative: 5.1 % (ref 3.0–12.0)
Neutro Abs: 3.3 10*3/uL (ref 1.4–7.7)
Neutrophils Relative %: 60.2 % (ref 43.0–77.0)
Platelets: 288 10*3/uL (ref 150.0–400.0)
RBC: 4.48 Mil/uL (ref 3.87–5.11)
RDW: 13.7 % (ref 11.5–15.5)
WBC: 5.6 10*3/uL (ref 4.0–10.5)

## 2022-05-13 LAB — BASIC METABOLIC PANEL
BUN: 14 mg/dL (ref 6–23)
CO2: 27 mEq/L (ref 19–32)
Calcium: 9.1 mg/dL (ref 8.4–10.5)
Chloride: 102 mEq/L (ref 96–112)
Creatinine, Ser: 0.91 mg/dL (ref 0.40–1.20)
GFR: 73.22 mL/min (ref >60.00)
Glucose, Bld: 89 mg/dL (ref 70–99)
Potassium: 4 mEq/L (ref 3.5–5.1)
Sodium: 137 mEq/L (ref 135–145)

## 2022-05-13 LAB — LIPID PANEL
Cholesterol: 286 mg/dL — ABNORMAL HIGH (ref 0–200)
HDL: 60.6 mg/dL (ref >39.00)
LDL Cholesterol: 207 mg/dL — ABNORMAL HIGH (ref 0–99)
NonHDL: 225.75
Total CHOL/HDL Ratio: 5
Triglycerides: 95 mg/dL (ref 0.0–149.0)
VLDL: 19 mg/dL (ref 0.0–40.0)

## 2022-05-13 LAB — TSH: TSH: 2.05 u[IU]/mL (ref 0.35–5.50)

## 2022-05-13 LAB — VITAMIN D 25 HYDROXY (VIT D DEFICIENCY, FRACTURES): VITD: 30.43 ng/mL (ref 30.00–100.00)

## 2022-05-13 LAB — HEMOGLOBIN A1C: Hgb A1c MFr Bld: 5.4 % (ref 4.6–6.5)

## 2022-05-13 MED ORDER — AMPHETAMINE-DEXTROAMPHET ER 10 MG PO CP24: 10.0000 mg | ORAL_CAPSULE | Freq: Every day | ORAL | 0 refills | Status: DC

## 2022-05-13 NOTE — Addendum Note (Signed)
Addended by: Gershon Crane A on: 05/13/2022 01:05 PM   Modules accepted: Orders

## 2022-05-13 NOTE — Progress Notes (Addendum)
Subjective:    Patient ID: April Kirk, female    DOB: [birth year], 52 y.o.   MRN: [id number]  HPI Here to follow up on issues. Her anxiety and depression are well controlled. We recently spoke about her weight gain and her difficulty with this. She is past due for a mammogram and she has never had a colonoscopy. The other issue she wants to discuss is her poor concentration and easy distractibility. She has noticed some of this her whole life, but this has become more evident the past 6 months. She has trouble focusing on everything she does, both at work and at home. She tends to start new projects before she ever finishes the older ones. She loses track of her thoughts when talking to people. She says her thoughts wander off when she is speaking to others, and she finds this very frustrating . Finally she noticed a hard lump in her abdomen about 6 months ago. This is not painful. She asks if it could be scar tissue from her abdominal surgeries.    Review of Systems  Constitutional: Negative.   HENT: Negative.    Eyes: Negative.   Respiratory: Negative.    Cardiovascular: Negative.   Gastrointestinal: Negative.   Genitourinary:  Negative for decreased urine volume, difficulty urinating, dyspareunia, dysuria, enuresis, flank pain, frequency, hematuria, pelvic pain and urgency.  Musculoskeletal: Negative.   Skin: Negative.   Neurological: Negative.  Negative for headaches.  Psychiatric/Behavioral:  Positive for decreased concentration and dysphoric mood. Negative for agitation, behavioral problems, confusion, hallucinations, self-injury, sleep disturbance and suicidal ideas. The patient is nervous/anxious.        Objective:   Physical Exam Constitutional:      General: She is not in acute distress.    Appearance: She is well-developed.  HENT:     Head: Normocephalic and atraumatic.     Right Ear: External ear normal.     Left Ear: External ear normal.     Nose: Nose normal.      Mouth/Throat:     Pharynx: No oropharyngeal exudate.  Eyes:     General: No scleral icterus.    Conjunctiva/sclera: Conjunctivae normal.     Pupils: Pupils are equal, round, and reactive to light.  Neck:     Thyroid: No thyromegaly.     Vascular: No JVD.  Cardiovascular:     Rate and Rhythm: Normal rate and regular rhythm.     Heart sounds: Normal heart sounds. No murmur heard.    No friction rub. No gallop.  Pulmonary:     Effort: Pulmonary effort is normal. No respiratory distress.     Breath sounds: Normal breath sounds. No wheezing or rales.  Chest:     Chest wall: No tenderness.  Abdominal:     General: Bowel sounds are normal. There is no distension.     Palpations: Abdomen is soft.     Tenderness: There is no abdominal tenderness. There is no guarding or rebound.     Comments: There is a round firm mobile non-tender mass in the muscular layer of the abdomen about 5 cm superior to the umbilicus   Musculoskeletal:        General: No tenderness. Normal range of motion.     Cervical back: Normal range of motion and neck supple.  Lymphadenopathy:     Cervical: No cervical adenopathy.  Skin:    General: Skin is warm and dry.     Findings: No erythema or rash.  Neurological:     Mental Status: She is alert and oriented to person, place, and time.     Cranial Nerves: No cranial nerve deficit.     Motor: No abnormal muscle tone.     Coordination: Coordination normal.     Deep Tendon Reflexes: Reflexes are normal and symmetric. Reflexes normal.  Psychiatric:        Behavior: Behavior normal.        Thought Content: Thought content normal.        Judgment: Judgment normal.           Assessment & Plan:  Her anxiety and depression are stable. We will get fasting labs to check her lipids, etc. To help with weight gain, I advised her to exercise 30 minutes 5 days a week . She also has ADHD, we will treat this with Adderall XR 10 mg daily. Recheck in 2 weeks. We will  investigate the abdominal mass with a CT scan. We spent a total of ( 32  ) minutes reviewing records and discussing these issues.  Gershon Crane, MD

## 2022-05-16 ENCOUNTER — Telehealth: Payer: Self-pay | Admitting: Family Medicine

## 2022-05-16 NOTE — Telephone Encounter (Signed)
Pt had her CPE on Monday, 05/13/22.  Pt would like a call back to go over her labs.

## 2022-05-17 ENCOUNTER — Other Ambulatory Visit: Payer: Self-pay

## 2022-05-17 DIAGNOSIS — E785 Hyperlipidemia, unspecified: Secondary | ICD-10-CM

## 2022-05-17 MED ORDER — ATORVASTATIN CALCIUM 20 MG PO TABS
20.0000 mg | ORAL_TABLET | Freq: Every day | ORAL | 3 refills | Status: DC
Start: 2022-05-17 — End: 2023-06-16

## 2022-05-20 NOTE — Telephone Encounter (Signed)
See results note. 

## 2022-06-11 ENCOUNTER — Telehealth: Payer: Self-pay | Admitting: Family Medicine

## 2022-06-11 NOTE — Telephone Encounter (Signed)
Regarding amphetamine-dextroamphetamine (ADDERALL XR) 10 MG 24 hr capsule patient states she can tell a difference, thinks it may be beneficial to move to 20mg .

## 2022-06-12 MED ORDER — AMPHETAMINE-DEXTROAMPHET ER 20 MG PO CP24
20.0000 mg | ORAL_CAPSULE | ORAL | 0 refills | Status: DC
Start: 2022-06-12 — End: 2022-09-04

## 2022-06-12 NOTE — Addendum Note (Signed)
Addended by: Gershon Crane A on: 06/12/2022 09:50 AM   Modules accepted: Orders

## 2022-06-12 NOTE — Telephone Encounter (Signed)
I sent in a month of the 20 mg dose for her to try

## 2022-06-13 NOTE — Telephone Encounter (Signed)
Pt.notified

## 2022-06-19 ENCOUNTER — Inpatient Hospital Stay: Admission: RE | Admit: 2022-06-19 | Payer: Medicaid Other | Source: Ambulatory Visit

## 2022-09-03 ENCOUNTER — Telehealth: Payer: Self-pay | Admitting: Family Medicine

## 2022-09-03 NOTE — Telephone Encounter (Signed)
Prescription Request  09/03/2022  LOV: 05/13/2022  What is the name of the medication or equipment? amphetamine-dextroamphetamine (ADDERALL XR) 20 MG 24 hr capsule   Have you contacted your pharmacy to request a refill? Yes   Which pharmacy would you like this sent to?  [facility] [address] [place] - [address] [place] [address] Phone: [phone] Fax: [phone]    Patient notified that their request is being sent to the clinical staff for review and that they should receive a response within 2 business days.   Please advise at

## 2022-09-04 MED ORDER — AMPHETAMINE-DEXTROAMPHET ER 20 MG PO CP24: 20.0000 mg | ORAL_CAPSULE | ORAL | 0 refills | Status: DC

## 2022-09-04 NOTE — Addendum Note (Signed)
Addended by: Gershon Crane A on: 09/04/2022 07:53 AM   Modules accepted: Orders

## 2022-09-04 NOTE — Telephone Encounter (Signed)
Done

## 2022-09-18 ENCOUNTER — Telehealth: Payer: Self-pay | Admitting: Family Medicine

## 2022-09-18 ENCOUNTER — Other Ambulatory Visit: Payer: Self-pay | Admitting: Family Medicine

## 2022-09-18 NOTE — Telephone Encounter (Signed)
Pt says the 25mg  amphetamine-dextroamphetamine (ADDERALL XR) 20 MG 24 hr capsule worked better for her and requesting those.

## 2022-09-18 NOTE — Telephone Encounter (Signed)
Pt LOV was on 05/13/22 Last refill was done on 05/08/22 Please advise

## 2022-09-19 NOTE — Telephone Encounter (Signed)
Left a detailed message for  pt to call the office back regarding the dose she wants sent to er pharmacy

## 2022-09-19 NOTE — Telephone Encounter (Signed)
Does she want the 25 mg or the 20 mg dose?

## 2022-09-20 MED ORDER — AMPHETAMINE-DEXTROAMPHET ER 25 MG PO CP24
25.0000 mg | ORAL_CAPSULE | ORAL | 0 refills | Status: DC
Start: 2022-09-20 — End: 2022-10-31

## 2022-09-20 NOTE — Telephone Encounter (Signed)
Done

## 2022-09-20 NOTE — Telephone Encounter (Signed)
Pt notified via My Chart

## 2022-09-20 NOTE — Addendum Note (Signed)
Addended by: Gershon Crane A on: 09/20/2022 04:35 PM   Modules accepted: Orders

## 2022-09-20 NOTE — Telephone Encounter (Signed)
Spoke with pt states that she is requesting for Adderall 25 mg and should go to [facility] on [address]

## 2022-10-02 ENCOUNTER — Ambulatory Visit: Payer: 59 | Admitting: Family Medicine

## 2022-10-04 ENCOUNTER — Encounter: Payer: Self-pay | Admitting: Family Medicine

## 2022-10-04 ENCOUNTER — Ambulatory Visit (INDEPENDENT_AMBULATORY_CARE_PROVIDER_SITE_OTHER): Payer: 59 | Admitting: Family Medicine

## 2022-10-04 VITALS — BP 110/80 | HR 67 | Temp 98.1°F | Wt 176.0 lb

## 2022-10-04 DIAGNOSIS — M25471 Effusion, right ankle: Secondary | ICD-10-CM | POA: Diagnosis not present

## 2022-10-04 DIAGNOSIS — M25472 Effusion, left ankle: Secondary | ICD-10-CM | POA: Diagnosis not present

## 2022-10-04 DIAGNOSIS — R030 Elevated blood-pressure reading, without diagnosis of hypertension: Secondary | ICD-10-CM

## 2022-10-04 MED ORDER — FUROSEMIDE 20 MG PO TABS: 20.0000 mg | ORAL_TABLET | Freq: Every day | ORAL | 3 refills | Status: AC | PRN

## 2022-10-04 NOTE — Progress Notes (Signed)
   Subjective:    Patient ID: April Kirk, female    DOB: [birth year], 52 y.o.   MRN: [id number]  HPI Here for 3 weeks of elevated BP to the 140's over 90's, along with swelling in the ankles and mild headaches. No SOB. No recent change in diet, but she has spent a lot of time outside in the heat attending her son's baseball games. Today she feels better. She had complete labs in April that were normal except for some elevated lipids    Review of Systems  Constitutional: Negative.   Respiratory: Negative.    Cardiovascular:  Positive for leg swelling. Negative for chest pain and palpitations.  Neurological:  Positive for headaches. Negative for dizziness and light-headedness.       Objective:   Physical Exam Constitutional:      Appearance: Normal appearance.  Cardiovascular:     Rate and Rhythm: Normal rate and regular rhythm.     Pulses: Normal pulses.     Heart sounds: Normal heart sounds.  Pulmonary:     Effort: Pulmonary effort is normal.     Breath sounds: Normal breath sounds.  Neurological:     General: No focal deficit present.     Mental Status: She is alert and oriented to person, place, and time.           Assessment & Plan:  She has had some transient fluid retention from the hot weather, and this has caused some ankle swelling and elevated BP. She will take Lasix 20 mg daily as needed. She will follow up if this persists.  Gershon Crane, MD

## 2022-10-30 ENCOUNTER — Telehealth: Payer: Self-pay | Admitting: Family Medicine

## 2022-10-30 NOTE — Telephone Encounter (Signed)
Prescription Request  10/30/2022  LOV: 10/04/2022  What is the name of the medication or equipment? amphetamine-dextroamphetamine (ADDERALL XR) 25 MG 24 hr capsule   Have you contacted your pharmacy to request a refill? No   Which pharmacy would you like this sent to?  [facility] [address], [place] - [address] [place] [address] Phone: [phone] Fax: [phone]    Patient notified that their request is being sent to the clinical staff for review and that they should receive a response within 2 business days.   Please advise at Mobile [phone]

## 2022-10-30 NOTE — Telephone Encounter (Signed)
Pt LOV was on 10/04/22 Pt last refill was done on 09/20/22 Please advise

## 2022-10-31 MED ORDER — AMPHETAMINE-DEXTROAMPHET ER 25 MG PO CP24: 25.0000 mg | ORAL_CAPSULE | ORAL | 0 refills | Status: DC

## 2022-10-31 NOTE — Telephone Encounter (Signed)
Done

## 2022-11-21 ENCOUNTER — Other Ambulatory Visit: Payer: Self-pay | Admitting: Family Medicine

## 2022-11-22 ENCOUNTER — Telehealth: Payer: Self-pay | Admitting: Family Medicine

## 2022-11-22 NOTE — Telephone Encounter (Signed)
Prescription Request  11/22/2022  LOV: 10/04/2022  What is the name of the medication or equipment? citalopram (CELEXA) 40 MG tablet     Have you contacted your pharmacy to request a refill? No   Which pharmacy would you like this sent to?  [facility] [address] [place] - [address] [place] [address] Phone: [phone] Fax: [phone]    Patient notified that their request is being sent to the clinical staff for review and that they should receive a response within 2 business days.   Please advise at Mobile There is no such number on file (mobile).

## 2022-11-25 ENCOUNTER — Other Ambulatory Visit: Payer: Self-pay

## 2022-11-25 MED ORDER — CITALOPRAM HYDROBROMIDE 40 MG PO TABS: 40.0000 mg | ORAL_TABLET | Freq: Every day | ORAL | 3 refills | Status: DC

## 2022-11-25 NOTE — Telephone Encounter (Signed)
Pt is checking on status of alprazolam

## 2022-11-25 NOTE — Telephone Encounter (Signed)
Rx sent 

## 2022-11-25 NOTE — Telephone Encounter (Signed)
Pt LOV was on 10/04/22 Last refill was done on 09/18/22 Please advise

## 2022-12-18 ENCOUNTER — Telehealth: Payer: Self-pay | Admitting: Family Medicine

## 2022-12-18 NOTE — Telephone Encounter (Signed)
Prescription Request  12/18/2022  LOV: 10/04/2022  What is the name of the medication or equipment? amphetamine-dextroamphetamine amphetamine-dextroamphetamine (ADDERALL XR) 25 MG 24 hr capsule  Have you contacted your pharmacy to request a refill? No   Which pharmacy would you like this sent to?  [facility] [address], [place] - [address] [place] [address] Phone: [phone] Fax: [phone]    Patient notified that their request is being sent to the clinical staff for review and that they should receive a response within 2 business days.   Please advise at Mobile There is no such number on file (mobile).

## 2022-12-19 NOTE — Telephone Encounter (Signed)
She already has refills until 01-30-23

## 2022-12-20 NOTE — Telephone Encounter (Signed)
Pt.notified

## 2023-02-13 ENCOUNTER — Other Ambulatory Visit: Payer: Self-pay | Admitting: Family Medicine

## 2023-02-19 ENCOUNTER — Other Ambulatory Visit: Payer: Self-pay | Admitting: Family Medicine

## 2023-02-19 ENCOUNTER — Telehealth: Payer: Self-pay

## 2023-02-19 NOTE — Telephone Encounter (Signed)
 Copied from CRM [id number]. Topic: Clinical - Medication Refill >> Feb 19, 2023  1:36 PM Adaysia C wrote: Most Recent Primary Care Visit:  Provider: Corita Diego A  Department: [facility]  Visit Type: OFFICE VISIT  Date: 10/04/2022  Medication: ALPRAZolam  (XANAX ) 0.5 MG tablet  Has the patient contacted their pharmacy? No, patient contacted provider to initiate refill (Agent: If no, request that the patient contact the pharmacy for the refill. If patient does not wish to contact the pharmacy document the reason why and proceed with request.) (Agent: If yes, when and what did the pharmacy advise?)  Is this the correct pharmacy for this prescription? Yes If no, delete pharmacy and type the correct one.  This is the patient's preferred pharmacy:   [facility] [address] [place] - [address] [place] [address] Phone: [phone] Fax: [phone]   Has the prescription been filled recently? No  Is the patient out of the medication?   Has the patient been seen for an appointment in the last year OR does the patient have an upcoming appointment?   Can we respond through MyChart?   Agent: Please be advised that Rx refills may take up to 3 business days. We ask that you follow-up with your pharmacy.

## 2023-02-19 NOTE — Telephone Encounter (Signed)
 Copied from CRM [id number]. Topic: Clinical - Medication Question >> Feb 19, 2023  1:39 PM Adaysia C wrote: Reason for CRM: Patient would like to know if providers can prescribe a small amount of amphetamine -dextroamphetamine (ADDERALL XR) 25 MG 24 hr capsule until her next scheduled appointment on 02/24/2023 because she is completely out.

## 2023-02-21 NOTE — Telephone Encounter (Signed)
Pt has appointment scheduled for 02/24/23. Please advise

## 2023-02-24 ENCOUNTER — Ambulatory Visit: Payer: 59 | Admitting: Family Medicine

## 2023-02-24 NOTE — Telephone Encounter (Signed)
She has an OV today 

## 2023-02-25 ENCOUNTER — Ambulatory Visit: Payer: 59 | Admitting: Family Medicine

## 2023-02-25 ENCOUNTER — Ambulatory Visit: Payer: Self-pay | Admitting: Family Medicine

## 2023-02-25 NOTE — Telephone Encounter (Signed)
  Chief Complaint: joint pain, toes and fingers Symptoms: pain, mild swelling Frequency: several months, worsening now Pertinent Negatives: Patient denies redness, fever, injury Disposition: [] ED /[] Urgent Care (no appt availability in office) / [x] Appointment(In office/virtual)/ []  [place] [facility]/ [] Home Care/ [] Refused Recommended Disposition /[] [place] [facility]/ []  Follow-up with PCP Additional Notes: Patient called reporting worsening joint pain in toes and fingers. Reports at time she has numbness in some of her toes and mild swelling. States she generally feels pain in all of her joints at this time. Pain 7/10. Denies injury, redness, streaking. States she has been communicating with PCP regarding these symptoms. Per protocol, patient to be evaluated within 3 days. Patient scheduled for first available with PCP at her request. Care advice reviewed with patient, understanding verbalized. Denies further questions at this time. Alerting PCP for review.    Copied from CRM [id number]. Topic: Appointments - Appointment Scheduling >> Feb 25, 2023  3:08 PM Turkey A wrote: Patient needs prescriptions refills so she is scheduling appointment to see Dr.Fry. Patient has pain in her joints 1-10 its a 7 or 8 Reason for Disposition  [1] MODERATE pain (e.g., interferes with normal activities, limping) AND [2] present > 3 days  Answer Assessment - Initial Assessment Questions 1. ONSET: "When did the pain start?"      Several months but worsening recently 2. LOCATION: "Where is the pain located?"   (e.g., around nail, entire toe, at foot joint)      Joints in toes and fingers 3. PAIN: "How bad is the pain?"    (Scale 1-10; or mild, moderate, severe)   -  MILD (1-3): doesn't interfere with normal activities    -  MODERATE (4-7): interferes with normal activities (e.g., work or school) or awakens from sleep, limping    -  SEVERE (8-10): excruciating pain, unable to do any normal activities,  unable to walk     7/10 4. APPEARANCE: "What does the toe look like?" (e.g., redness, swelling, bruising, pallor)     Swelling intermittently 5. CAUSE: "What do you think is causing the toe pain?"     Unsure 6. OTHER SYMPTOMS: "Do you have any other symptoms?" (e.g., leg pain, rash, fever, numbness)     Numbness intermittently in some of toes  Protocols used: Toe Pain-A-AH

## 2023-02-26 ENCOUNTER — Encounter: Payer: Self-pay | Admitting: Family Medicine

## 2023-02-26 ENCOUNTER — Ambulatory Visit (INDEPENDENT_AMBULATORY_CARE_PROVIDER_SITE_OTHER): Payer: 59

## 2023-02-26 ENCOUNTER — Ambulatory Visit (INDEPENDENT_AMBULATORY_CARE_PROVIDER_SITE_OTHER): Payer: 59 | Admitting: Family Medicine

## 2023-02-26 VITALS — BP 120/76 | HR 99 | Temp 98.2°F | Wt 164.0 lb

## 2023-02-26 DIAGNOSIS — M2550 Pain in unspecified joint: Secondary | ICD-10-CM | POA: Diagnosis not present

## 2023-02-26 DIAGNOSIS — F900 Attention-deficit hyperactivity disorder, predominantly inattentive type: Secondary | ICD-10-CM | POA: Diagnosis not present

## 2023-02-26 DIAGNOSIS — F418 Other specified anxiety disorders: Secondary | ICD-10-CM

## 2023-02-26 DIAGNOSIS — I878 Other specified disorders of veins: Secondary | ICD-10-CM | POA: Diagnosis not present

## 2023-02-26 DIAGNOSIS — E785 Hyperlipidemia, unspecified: Secondary | ICD-10-CM | POA: Diagnosis not present

## 2023-02-26 DIAGNOSIS — M25551 Pain in right hip: Secondary | ICD-10-CM

## 2023-02-26 LAB — CBC WITH DIFFERENTIAL/PLATELET
Basophils Absolute: 0 10*3/uL (ref 0.0–0.1)
Basophils Relative: 1.1 % (ref 0.0–3.0)
Eosinophils Absolute: 0.1 10*3/uL (ref 0.0–0.7)
Eosinophils Relative: 3.3 % (ref 0.0–5.0)
HCT: 39.6 % (ref 36.0–46.0)
Hemoglobin: 13.3 g/dL (ref 12.0–15.0)
Lymphocytes Relative: 39.1 % (ref 12.0–46.0)
Lymphs Abs: 1.6 10*3/uL (ref 0.7–4.0)
MCHC: 33.6 g/dL (ref 30.0–36.0)
MCV: 88.4 fL (ref 78.0–100.0)
Monocytes Absolute: 0.2 10*3/uL (ref 0.1–1.0)
Monocytes Relative: 4.9 % (ref 3.0–12.0)
Neutro Abs: 2.1 10*3/uL (ref 1.4–7.7)
Neutrophils Relative %: 51.6 % (ref 43.0–77.0)
Platelets: 298 10*3/uL (ref 150.0–400.0)
RBC: 4.48 Mil/uL (ref 3.87–5.11)
RDW: 13.4 % (ref 11.5–15.5)
WBC: 4.2 10*3/uL (ref 4.0–10.5)

## 2023-02-26 LAB — LIPID PANEL
Cholesterol: 242 mg/dL — ABNORMAL HIGH (ref 0–200)
HDL: 49.8 mg/dL (ref >39.00)
LDL Cholesterol: 164 mg/dL — ABNORMAL HIGH (ref 0–99)
NonHDL: 192.17
Total CHOL/HDL Ratio: 5
Triglycerides: 140 mg/dL (ref 0.0–149.0)
VLDL: 28 mg/dL (ref 0.0–40.0)

## 2023-02-26 LAB — C-REACTIVE PROTEIN: CRP: <1 mg/dL (ref 0.5–20.0)

## 2023-02-26 LAB — SEDIMENTATION RATE: Sed Rate: 24 mm/h (ref 0–30)

## 2023-02-26 LAB — TSH: TSH: 2.22 u[IU]/mL (ref 0.35–5.50)

## 2023-02-26 MED ORDER — AMPHETAMINE-DEXTROAMPHET ER 25 MG PO CP24: 25.0000 mg | ORAL_CAPSULE | ORAL | 0 refills | Status: DC

## 2023-02-26 MED ORDER — ALPRAZOLAM 0.5 MG PO TABS: 0.5000 mg | ORAL_TABLET | Freq: Four times a day (QID) | ORAL | 5 refills | Status: DC | PRN

## 2023-02-26 MED ORDER — CELECOXIB 200 MG PO CAPS: 200.0000 mg | ORAL_CAPSULE | Freq: Two times a day (BID) | ORAL | 5 refills | Status: AC

## 2023-02-26 NOTE — Progress Notes (Signed)
   Subjective:    Patient ID: April Kirk, female    DOB: 10-07-1970, 53 y.o.   MRN: [id number]  HPI Here for several issues. First she needs refills on Xanax. Her anxiety is about the same as long as she takes the Xanax regularly. Her ADHD is about the same, and she needs to take the Adderall every day to function. The other issue is joint pains. She has had stiffness and pain in multiple joints for several years, but over the past 6 months this is getting much worse. She used to get relief with Ibuprofen, but this does not help much now. The pains are in both hands and both feet, as well as the right hip and right knee. The right hip is the worst of all.     Review of Systems  Constitutional: Negative.   Respiratory: Negative.    Cardiovascular: Negative.   Musculoskeletal:  Positive for arthralgias.  Neurological: Negative.   Psychiatric/Behavioral:  Positive for decreased concentration. Negative for agitation, behavioral problems, confusion, dysphoric mood, hallucinations and sleep disturbance. The patient is nervous/anxious.        Objective:   Physical Exam Constitutional:      Appearance: Normal appearance.  Cardiovascular:     Rate and Rhythm: Normal rate and regular rhythm.     Pulses: Normal pulses.     Heart sounds: Normal heart sounds.  Pulmonary:     Effort: Pulmonary effort is normal.     Breath sounds: Normal breath sounds.  Musculoskeletal:     Comments: No obvious swelling of the joints. She has tenderness in all MCP joints. The wrists are normal.   Neurological:     General: No focal deficit present.     Mental Status: She is alert and oriented to person, place, and time.  Psychiatric:        Mood and Affect: Mood normal.        Behavior: Behavior normal.        Thought Content: Thought content normal.           Assessment & Plan:  Her anxiety is stable, and we refilled the Xanax. Her ADHD is stable, and we refilled the Adderall XR. The joint pains raise  the possibility of an inflammatory arthritis such as RA. She will try Celebrex 200 mg BID for this. We will also get labs to include ESR, CRP and RF. We will also get Xrays of the right hip. We spent a total of ( 34  ) minutes reviewing records and discussing these issues.  Gershon Crane, MD

## 2023-02-26 NOTE — Telephone Encounter (Signed)
Had appt today

## 2023-02-27 ENCOUNTER — Telehealth: Payer: Self-pay | Admitting: *Deleted

## 2023-02-27 DIAGNOSIS — E785 Hyperlipidemia, unspecified: Secondary | ICD-10-CM

## 2023-02-27 MED ORDER — ATORVASTATIN CALCIUM 40 MG PO TABS: 40.0000 mg | ORAL_TABLET | Freq: Every day | ORAL | 3 refills | Status: AC

## 2023-02-27 NOTE — Telephone Encounter (Signed)
Rx and lab order entered per results note.

## 2023-02-28 LAB — RHEUMATOID FACTOR: Rheumatoid fact SerPl-aCnc: <10 [IU]/mL (ref <14)

## 2023-02-28 LAB — ANA: Anti Nuclear Antibody (ANA): NEGATIVE

## 2023-03-11 DIAGNOSIS — M25551 Pain in right hip: Secondary | ICD-10-CM

## 2023-03-11 DIAGNOSIS — M2550 Pain in unspecified joint: Secondary | ICD-10-CM

## 2023-06-02 ENCOUNTER — Other Ambulatory Visit: Payer: MEDICAID

## 2023-06-02 ENCOUNTER — Other Ambulatory Visit: Payer: Self-pay | Admitting: Family Medicine

## 2023-06-02 NOTE — Telephone Encounter (Signed)
 Copied from CRM [id number]. Topic: Clinical - Medication Refill >> Jun 02, 2023 10:00 AM Allyne Areola wrote: Most Recent Primary Care Visit:  Provider: Corita Diego A  Department: [facility]  Visit Type: ACUTE  Date: 02/26/2023  Medication: amphetamine -dextroamphetamine (ADDERALL XR) 25 MG 24 hr capsule  Has the patient contacted their pharmacy? Yes, asked patient to contact primary care provider (Agent: If no, request that the patient contact the pharmacy for the refill. If patient does not wish to contact the pharmacy document the reason why and proceed with request.) (Agent: If yes, when and what did the pharmacy advise?)  Is this the correct pharmacy for this prescription? Yes If no, delete pharmacy and type the correct one.  This is the patient's preferred pharmacy:  [facility] [address], [place] - [address] [place]  [address] Phone: [phone] Fax: [phone]    Has the prescription been filled recently? No  Is the patient out of the medication? Yes  Has the patient been seen for an appointment in the last year OR does the patient have an upcoming appointment? Yes  Can we respond through MyChart? Yes  Agent: Please be advised that Rx refills may take up to 3 business days. We ask that you follow-up with your pharmacy.

## 2023-06-03 ENCOUNTER — Ambulatory Visit: Payer: MEDICAID | Admitting: Family Medicine

## 2023-06-05 ENCOUNTER — Encounter: Payer: Self-pay | Admitting: Family Medicine

## 2023-06-05 ENCOUNTER — Ambulatory Visit (INDEPENDENT_AMBULATORY_CARE_PROVIDER_SITE_OTHER): Payer: MEDICAID | Admitting: Family Medicine

## 2023-06-05 VITALS — BP 120/82 | HR 72 | Temp 98.4°F | Wt 153.6 lb

## 2023-06-05 DIAGNOSIS — Z23 Encounter for immunization: Secondary | ICD-10-CM

## 2023-06-05 DIAGNOSIS — N390 Urinary tract infection, site not specified: Secondary | ICD-10-CM

## 2023-06-05 DIAGNOSIS — J40 Bronchitis, not specified as acute or chronic: Secondary | ICD-10-CM | POA: Diagnosis not present

## 2023-06-05 DIAGNOSIS — R300 Dysuria: Secondary | ICD-10-CM

## 2023-06-05 LAB — POCT URINALYSIS DIPSTICK
Bilirubin, UA: NEGATIVE
Glucose, UA: NEGATIVE
Ketones, UA: NEGATIVE
Nitrite, UA: NEGATIVE
Protein, UA: POSITIVE — AB
Spec Grav, UA: >=1.03 — AB (ref 1.010–1.025)
Urobilinogen, UA: 0.2 U/dL
pH, UA: 5.5 (ref 5.0–8.0)

## 2023-06-05 MED ORDER — AMOXICILLIN-POT CLAVULANATE 875-125 MG PO TABS
1.0000 | ORAL_TABLET | Freq: Two times a day (BID) | ORAL | 0 refills | Status: DC
Start: 2023-06-05 — End: 2023-06-11

## 2023-06-05 MED ORDER — AMPHETAMINE-DEXTROAMPHET ER 25 MG PO CP24: 25.0000 mg | ORAL_CAPSULE | ORAL | 0 refills | Status: DC

## 2023-06-05 NOTE — Progress Notes (Signed)
   Subjective:    Patient ID: April Kirk, female    DOB: [birth year], 53 y.o.   MRN: [id number]  HPI Here for several issues. First she began to have sinus congestion and headaches about 4 weeks ago. She took Mucinex and Allegra, aand she felt better for awhile. Then one week ago she developed chest congestion, coughing up yellow sputum, and mild SOB. No fever. In addition 5 days ago she began to have urgency with urination and burning.    Review of Systems  Constitutional: Negative.   HENT:  Positive for congestion, postnasal drip and sinus pressure. Negative for ear pain and sore throat.   Eyes: Negative.   Respiratory:  Positive for cough, chest tightness and shortness of breath. Negative for wheezing.   Cardiovascular: Negative.   Gastrointestinal: Negative.   Genitourinary:  Positive for dysuria, frequency and urgency. Negative for flank pain and hematuria.       Objective:   Physical Exam Constitutional:      Appearance: Normal appearance.  HENT:     Right Ear: Tympanic membrane, ear canal and external ear normal.     Left Ear: Tympanic membrane, ear canal and external ear normal.     Nose: Nose normal.     Mouth/Throat:     Pharynx: Oropharynx is clear.  Eyes:     Conjunctiva/sclera: Conjunctivae normal.  Pulmonary:     Effort: Pulmonary effort is normal.     Breath sounds: Rhonchi present. No wheezing or rales.  Abdominal:     Tenderness: There is no right CVA tenderness or left CVA tenderness.  Lymphadenopathy:     Cervical: No cervical adenopathy.  Neurological:     Mental Status: She is alert.           Assessment & Plan:  She has a bronchitis, and we will treat this with 10 days of Augmentin . She also has a UTI, and hopefully the Augmentin  will treat this as well. We will send the sample for a culture. I advise her to drink lots of fluids.  Corita Diego, MD

## 2023-06-06 LAB — URINE CULTURE
MICRO NUMBER:: 16400951
Result:: NO GROWTH
SPECIMEN QUALITY:: ADEQUATE

## 2023-06-09 ENCOUNTER — Encounter: Payer: Self-pay | Admitting: *Deleted

## 2023-06-10 ENCOUNTER — Ambulatory Visit: Payer: Self-pay

## 2023-06-10 NOTE — Telephone Encounter (Signed)
 Tell her to stop the Augmentin , instead call in Bactrim DS BID for 10 days

## 2023-06-10 NOTE — Telephone Encounter (Signed)
 Chief Complaint: Chest/nasal congestion, urinary symptoms Symptoms: Runny nose, urinary odor Frequency: x 10 days Pertinent Negatives: Patient denies fever Disposition: [] ED /[] Urgent Care (no appt availability in office) / [x] Appointment(In office/virtual)/ []  [place] [facility]/ [] Home Care/ [] Refused Recommended Disposition /[] [place] [facility]/ []  Follow-up with PCP Additional Notes: Pt reports she was seen in office last week and was prescribed abx for URI. Pt notes her urinary symptoms nor her URI symptoms have improved. Pt notes she continues with very odorous dark urine, pt notes "white specks" in her urine and states it seems "thick. Notes cough is mildly less productive but her chest still feels very congested, SOB with exertion. OV scheduled per protocol. This RN educated pt on home care, new-worsening symptoms, when to call back/seek emergent care. Pt verbalized understanding and agrees to plan.    Copied from CRM [id number]. Topic: Clinical - Pink Word Triage >> Jun 10, 2023 10:07 AM Howard Macho wrote: Reason for CRM: patient called stating she saw her provider on last Thursday and stated she had bronchitis. Patient stated they did a urine culture and she got receive her results on mychart,  but she is still having issues and she would like to be called concerning this. Patient still has chest congestion and her pee is concentrated, very cloudy and she see white specs through the cloudiness  CB [phone] Reason for Disposition  [1] Taking antibiotic > 72 hours (3 days) AND [2] sinus pain not improved  Bad or foul-smelling urine  Answer Assessment - Initial Assessment Questions 1. ANTIBIOTIC: "What antibiotic are you taking?" "How many times a day?"     Augmentin  2. ONSET: "When was the antibiotic started?"     05/01 3. PAIN: "How bad is the sinus pain?"   (Scale 1-10; mild, moderate or severe)   - MILD (1-3): doesn't interfere with normal activities    - MODERATE (4-7):  interferes with normal activities (e.g., work or school) or awakens from sleep   - SEVERE (8-10): excruciating pain and patient unable to do any normal activities        Pressure, sinus HA daily 4. FEVER: "Do you have a fever?" If Yes, ask: "What is it, how was it measured, and when did it start?"      None 5. SYMPTOMS: "Are there any other symptoms you're concerned about?" If Yes, ask: "When did it start?"     Runny nose, mildly productive cough  Answer Assessment - Initial Assessment Questions 1. SYMPTOM: "What's the main symptom you're concerned about?" (e.g., frequency, incontinence)     Odor 2. ONSET: "When did the  symptoms  start?"     X 10 days 4. CAUSE: "What do you think is causing the symptoms?"     Unknown 5. OTHER SYMPTOMS: "Do you have any other symptoms?" (e.g., blood in urine, fever, flank pain, pain with urination)     Pt reports "white specks" in urine, strong odor, "thick"  Protocols used: Sinus Infection on Antibiotic Follow-up Call-A-AH, Urinary Symptoms-A-AH

## 2023-06-10 NOTE — Telephone Encounter (Signed)
 Pt has appointment with Dr Alyne Babinski on 06/11/23

## 2023-06-11 ENCOUNTER — Encounter: Payer: Self-pay | Admitting: Family Medicine

## 2023-06-11 ENCOUNTER — Ambulatory Visit (INDEPENDENT_AMBULATORY_CARE_PROVIDER_SITE_OTHER): Payer: MEDICAID | Admitting: Family Medicine

## 2023-06-11 VITALS — BP 110/68 | HR 77 | Temp 98.3°F | Wt 153.0 lb

## 2023-06-11 DIAGNOSIS — N390 Urinary tract infection, site not specified: Secondary | ICD-10-CM

## 2023-06-11 DIAGNOSIS — J40 Bronchitis, not specified as acute or chronic: Secondary | ICD-10-CM | POA: Diagnosis not present

## 2023-06-11 LAB — POCT URINALYSIS DIPSTICK
Bilirubin, UA: NEGATIVE
Glucose, UA: NEGATIVE
Ketones, UA: NEGATIVE
Nitrite, UA: NEGATIVE
Protein, UA: POSITIVE — AB
Spec Grav, UA: >=1.03 — AB (ref 1.010–1.025)
Urobilinogen, UA: 0.2 U/dL
pH, UA: 5.5 (ref 5.0–8.0)

## 2023-06-11 MED ORDER — SULFAMETHOXAZOLE-TRIMETHOPRIM 800-160 MG PO TABS: 1.0000 | ORAL_TABLET | Freq: Two times a day (BID) | ORAL | 0 refills | Status: DC

## 2023-06-11 NOTE — Telephone Encounter (Signed)
 Pt had appointment with Dr Alyne Babinski this morning for this problem

## 2023-06-11 NOTE — Progress Notes (Signed)
   Subjective:    Patient ID: April Kirk, female    DOB: 1970/05/21, 53 y.o.   MRN: [id number]  HPI Here to follow up on a bronchitis and a UTI that were diagnosed when we saw her on 06-05-23. That day she was coughing and had a positive UA. She was given 10 days of Augmentin . The urine culture showed no growth. Today she feels a little better overall, but she is still coughing and she still has UTI symptoms. No fever.    Review of Systems  Constitutional: Negative.   HENT:  Positive for congestion and sinus pressure. Negative for ear pain, postnasal drip and sore throat.   Eyes: Negative.   Respiratory:  Positive for cough. Negative for shortness of breath and wheezing.   Genitourinary:  Positive for dysuria, frequency and urgency. Negative for flank pain and hematuria.       Objective:   Physical Exam Constitutional:      Appearance: Normal appearance.  HENT:     Right Ear: Tympanic membrane, ear canal and external ear normal.     Left Ear: Tympanic membrane, ear canal and external ear normal.     Nose: Nose normal.     Mouth/Throat:     Pharynx: Oropharynx is clear.  Eyes:     Conjunctiva/sclera: Conjunctivae normal.  Pulmonary:     Effort: Pulmonary effort is normal.     Breath sounds: Normal breath sounds.  Abdominal:     Tenderness: There is no right CVA tenderness or left CVA tenderness.  Lymphadenopathy:     Cervical: No cervical adenopathy.  Neurological:     Mental Status: She is alert.           Assessment & Plan:  Partially treated bronchitis and a UTI. This time she wil take 10 days of Bactrim DS. We will culture the urine again.  Corita Diego, MD

## 2023-06-11 NOTE — Addendum Note (Signed)
 Addended by: Philbert Brave on: 06/11/2023 09:39 AM   Modules accepted: Orders

## 2023-06-12 LAB — URINE CULTURE
MICRO NUMBER:: 16425020
Result:: NO GROWTH
SPECIMEN QUALITY:: ADEQUATE

## 2023-06-13 ENCOUNTER — Encounter: Payer: Self-pay | Admitting: *Deleted

## 2023-06-13 MED ORDER — NITROFURANTOIN MONOHYD MACRO 100 MG PO CAPS: 100.0000 mg | ORAL_CAPSULE | Freq: Two times a day (BID) | ORAL | 0 refills | Status: DC

## 2023-06-13 NOTE — Telephone Encounter (Signed)
 This could be due to one of two things. Either she has some inflammation of the bladder called interstitial cystitis which has symptoms similar to a UTI, or the lab simply could not get the bacteria to grow. Let's try one more different antibiotic, and then have her report back in a week. I sent in 7 days of Macrobid for her to try

## 2023-06-14 ENCOUNTER — Other Ambulatory Visit: Payer: Self-pay | Admitting: Family Medicine

## 2023-06-14 DIAGNOSIS — E785 Hyperlipidemia, unspecified: Secondary | ICD-10-CM

## 2023-06-16 ENCOUNTER — Other Ambulatory Visit: Payer: Self-pay

## 2023-06-25 NOTE — Telephone Encounter (Signed)
 I don't really have any specific GYN providers to recommend. The ones I used to recommend have all retired !

## 2023-09-04 ENCOUNTER — Other Ambulatory Visit: Payer: Self-pay | Admitting: Family Medicine

## 2023-09-04 NOTE — Telephone Encounter (Signed)
 Copied from CRM [id number]. Topic: Clinical - Medication Refill >> Sep 04, 2023  5:08 PM Drema MATSU wrote: Medication: amphetamine -dextroamphetamine (ADDERALL XR) 25 MG 24 hr capsule  Has the patient contacted their pharmacy? Yes (Agent: If no, request that the patient contact the pharmacy for the refill. If patient does not wish to contact the pharmacy document the reason why and proceed with request.) patient was told she needs a new prescription from doctor because they don't have it on  (Agent: If yes, when and what did the pharmacy advise?)  This is the patient's preferred pharmacy:  [facility] [address] [place] - [address] [place] [address] Phone: [phone] Fax: [phone]   Is this the correct pharmacy for this prescription? Yes If no, delete pharmacy and type the correct one.   Has the prescription been filled recently? Yes  Is the patient out of the medication? Yes has been out a week  Has the patient been seen for an appointment in the last year OR does the patient have an upcoming appointment? Yes  Can we respond through MyChart? Yes  Agent: Please be advised that Rx refills may take up to 3 business days. We ask that you follow-up with your pharmacy.

## 2023-09-09 ENCOUNTER — Other Ambulatory Visit: Payer: Self-pay | Admitting: Family Medicine

## 2023-09-09 MED ORDER — AMPHETAMINE-DEXTROAMPHET ER 25 MG PO CP24: 25.0000 mg | ORAL_CAPSULE | ORAL | 0 refills | Status: DC

## 2023-09-09 NOTE — Telephone Encounter (Signed)
 Done

## 2023-09-11 ENCOUNTER — Telehealth: Payer: Self-pay | Admitting: *Deleted

## 2023-09-11 NOTE — Telephone Encounter (Signed)
 Copied from CRM [id number]. Topic: Clinical - Prescription Issue >> Sep 11, 2023 12:29 PM Larissa RAMAN wrote: Reason for CRM: Patient states her pharmacy has not received prescription for ALPRAZolam  (XANAX ) 0.5 MG tablet. She is requesting to have prescription resent to her pharmacy.

## 2023-09-15 NOTE — Telephone Encounter (Signed)
 Spoke with pt pharmacy states that pt already picked up Rx on 09/10/23

## 2023-10-20 ENCOUNTER — Other Ambulatory Visit: Payer: Self-pay | Admitting: Family Medicine

## 2023-10-20 NOTE — Telephone Encounter (Unsigned)
 Copied from CRM [id number]. Topic: Clinical - Medication Refill >> Oct 20, 2023  3:01 PM Macario HERO wrote: Medication: citalopram  (CELEXA ) 40 MG tablet [[id number]] ALPRAZolam  (XANAX ) 0.5 MG tablet [[id number]] amphetamine -dextroamphetamine (ADDERALL XR) 25 MG 24 hr capsule [[id number]]  Has the patient contacted their pharmacy? Yes (Agent: If no, request that the patient contact the pharmacy for the refill. If patient does not wish to contact the pharmacy document the reason why and proceed with request.) (Agent: If yes, when and what did the pharmacy advise?) pharmacy said they sent it over twice  This is the patient's preferred pharmacy:  [facility] [address], [place] - [address] [place] [address] Phone: [phone] Fax: [phone]    Is this the correct pharmacy for this prescription? Yes If no, delete pharmacy and type the correct one.   Has the prescription been filled recently? Yes  Is the patient out of the medication? Yes  Has the patient been seen for an appointment in the last year OR does the patient have an upcoming appointment? Yes  Can we respond through MyChart? Yes  Agent: Please be advised that Rx refills may take up to 3 business days. We ask that you follow-up with your pharmacy.

## 2023-10-24 MED ORDER — CITALOPRAM HYDROBROMIDE 40 MG PO TABS: 40.0000 mg | ORAL_TABLET | Freq: Every day | ORAL | 3 refills | Status: AC

## 2023-12-22 ENCOUNTER — Other Ambulatory Visit: Payer: Self-pay | Admitting: Family Medicine

## 2023-12-22 NOTE — Telephone Encounter (Unsigned)
 Copied from CRM #[id number]. Topic: Clinical - Medication Refill >> Dec 22, 2023  4:14 PM Antwanette L wrote: Medication: amphetamine -dextroamphetamine (ADDERALL XR) 25 MG 24 hr capsule  Has the patient contacted their pharmacy? Yes   This is the patient's preferred pharmacy:  [facility] [address] [place] - [address] [place] [address] Phone: [phone] Fax: [phone]    Is this the correct pharmacy for this prescription? Yes   Has the prescription been filled recently? Yes. Last refill was on 09/09/23  Is the patient out of the medication? Yes  Has the patient been seen for an appointment in the last year OR does the patient have an upcoming appointment? Yes. Last ov with Dr. Johnny was on 06/05/23  Can we respond through MyChart? No. Contact the patient by phone at [phone]  Agent: Please be advised that Rx refills may take up to 3 business days. We ask that you follow-up with your pharmacy.

## 2023-12-24 MED ORDER — AMPHETAMINE-DEXTROAMPHET ER 25 MG PO CP24: 25.0000 mg | ORAL_CAPSULE | ORAL | 0 refills | Status: DC

## 2023-12-24 NOTE — Telephone Encounter (Signed)
 Done

## 2024-01-13 ENCOUNTER — Ambulatory Visit: Payer: Self-pay

## 2024-01-13 NOTE — Telephone Encounter (Signed)
 FYI Only or Action Required?: FYI only for provider: appointment scheduled on 01/14/2024 at 10 AM.  Patient was last seen in primary care on 06/11/2023 by Johnny Garnette LABOR, MD.  Called Nurse Triage reporting Cough.  Symptoms began several weeks ago.  Interventions attempted: OTC medications: Mucinex, Tylenol and Ibuprofen , Prescription medications: See MAR, and Rest, hydration, or home remedies.  Symptoms are: unchanged.  Triage Disposition: See Physician Within 24 Hours  Patient/caregiver understands and will follow disposition?: Yes  Copied from CRM [id number]. Topic: Clinical - Red Word Triage >> Jan 13, 2024  1:11 PM Shereese L wrote: [facility] that prompted transfer to Nurse Triage: Patient stated that wheezing, chest pain and coughing Reason for Disposition  SEVERE coughing spells (e.g., whooping sound after coughing, vomiting after coughing)  Answer Assessment - Initial Assessment Questions Reports being ill for three weeks. Patient states she was seen at an urgent care and started on medication. Patient reports she was being treated for possible PNA. Patient reports symptoms haven't gotten much better. Requesting to be seen tomorrow.   1. ONSET: When did the cough begin?      Started 12/31/2023 2. SEVERITY: How bad is the cough today?      severe 3. SPUTUM: Describe the color of your sputum (e.g., none, dry cough; clear, white, yellow, green)     green 4. HEMOPTYSIS: Are you coughing up any blood? If Yes, ask: How much? (e.g., flecks, streaks, tablespoons, etc.)     no 5. DIFFICULTY BREATHING: Are you having difficulty breathing? If Yes, ask: How bad is it? (e.g., mild, moderate, severe)      No difficulty breathing 6. FEVER: Do you have a fever? If Yes, ask: What is your temperature, how was it measured, and when did it start?     no 7. CARDIAC HISTORY: Do you have any history of heart disease? (e.g., heart attack, congestive heart failure)      no 8. LUNG  HISTORY: Do you have any history of lung disease?  (e.g., pulmonary embolus, asthma, emphysema)     no 9. PE RISK FACTORS: Do you have a history of blood clots? (or: recent major surgery, recent prolonged travel, bedridden)     no 10. OTHER SYMPTOMS: Do you have any other symptoms? (e.g., runny nose, wheezing, chest pain)       Wheezing at times, chest pain at coughing, runny nose 12. TRAVEL: Have you traveled out of the country in the last month? (e.g., travel history, exposures)       no  Protocols used: Cough - Acute Productive-A-AH

## 2024-01-14 ENCOUNTER — Encounter: Payer: Self-pay | Admitting: Family Medicine

## 2024-01-14 ENCOUNTER — Ambulatory Visit: Payer: MEDICAID | Admitting: Family Medicine

## 2024-01-14 VITALS — BP 136/74 | HR 87 | Temp 98.1°F | Wt 150.6 lb

## 2024-01-14 DIAGNOSIS — J019 Acute sinusitis, unspecified: Secondary | ICD-10-CM | POA: Diagnosis not present

## 2024-01-14 DIAGNOSIS — R058 Other specified cough: Secondary | ICD-10-CM

## 2024-01-14 MED ORDER — AMOXICILLIN-POT CLAVULANATE 875-125 MG PO TABS: 1.0000 | ORAL_TABLET | Freq: Two times a day (BID) | ORAL | 0 refills | Status: AC

## 2024-01-14 MED ORDER — PREDNISONE 20 MG PO TABS: 20.0000 mg | ORAL_TABLET | Freq: Every day | ORAL | 0 refills | Status: AC

## 2024-01-14 NOTE — Patient Instructions (Signed)
Follow up for any fever, increased shortness of breath, or persistent cough.

## 2024-01-14 NOTE — Progress Notes (Signed)
 Established Patient Office Visit  Subjective   Patient ID: ERIS HANNAN, female    DOB: October 31, 1970  Age: 53 y.o. MRN: [id number]  Chief Complaint  Patient presents with   Cough   Facial Pain   Wheezing    HPI   April Kirk is seen today as a work in with some persistent cough, facial pain, and possible wheezing.  She does smoke though very lightly.  Before Thanksgiving she came down with a respiratory illness.  She went to urgent care and at that point and had fever up to 103.  Was treated with several things including doxycycline, steroid shot, oral prednisone for 7 days, albuterol.  She had flu and COVID testing which were reportedly negative.  Also reportedly had chest x-ray with no clear pneumonia noted.  She does feel some better at this time but still has some facial pain and green-yellow productive cough as well as facial discharge.  Still some facial pain.  No hemoptysis.  No documented fever.  Past Medical History:  Diagnosis Date   Anxiety    Depression    Headache(784.0)    Past Surgical History:  Procedure Laterality Date   ABDOMINAL HYSTERECTOMY     at age 77, per Dr. Lenon, ovaries left intact   HERNIA REPAIR     umbilical ,at age 48, per Dr. Eletha    reports that she has been smoking cigarettes. She has a 2.5 pack-year smoking history. She has never used smokeless tobacco. She reports that she does not drink alcohol and does not use drugs. family history includes Arthritis in her unknown relative; Cancer in her unknown relative; Diabetes in her unknown relative; Heart disease in her unknown relative; Hyperlipidemia in her unknown relative; Hypertension in her unknown relative. Allergies  Allergen Reactions   Levofloxacin   ]  Review of Systems  Constitutional:  Negative for chills and fever.  Respiratory:  Positive for cough and sputum production. Negative for hemoptysis and shortness of breath.       Objective:     BP 136/74   Pulse 87   Temp 98.1 F  (36.7 C) (Oral)   Wt 150 lb 9.6 oz (68.3 kg)   SpO2 99%   BMI 26.68 kg/m  BP Readings from Last 3 Encounters:  01/14/24 136/74  06/11/23 110/68  06/05/23 120/82   Wt Readings from Last 3 Encounters:  01/14/24 150 lb 9.6 oz (68.3 kg)  06/11/23 153 lb (69.4 kg)  06/05/23 153 lb 9.6 oz (69.7 kg)      Physical Exam Vitals reviewed.  Constitutional:      General: She is not in acute distress.    Appearance: She is not ill-appearing.  Cardiovascular:     Rate and Rhythm: Normal rate and regular rhythm.  Pulmonary:     Comments: Faint expiratory wheezes-lower lung zones.  No rales.  Pulse oximetry 99% room air Neurological:     Mental Status: She is alert.      No results found for any visits on 01/14/24.    The 10-year ASCVD risk score (Arnett DK, et al., 2019) is: 6.3%    Assessment & Plan:   Persistent productive cough with mild reactive airway component.  Patient no respiratory distress.  Suspect she may have concomitant sinusitis.  Did not resolve with doxycycline.  Wrote for Augmentin  875 mg twice daily for 10 days for some anaerobic coverage and prednisone 20 mg daily for 7 days.  Continue albuterol as needed.  Touch base  if cough not resolving next 2 weeks  Wolm Scarlet, MD

## 2024-01-15 NOTE — Telephone Encounter (Signed)
 She was seen yesterday.

## 2024-01-30 ENCOUNTER — Other Ambulatory Visit: Payer: Self-pay | Admitting: Family Medicine

## 2024-01-30 NOTE — Telephone Encounter (Unsigned)
 Copied from CRM #[id number]. Topic: Clinical - Medication Refill >> Jan 30, 2024  9:54 AM Alfonso HERO wrote: Medication: amphetamine -dextroamphetamine (ADDERALL XR) 25 MG 24 hr capsule  Has the patient contacted their pharmacy? Yes (Agent: If no, request that the patient contact the pharmacy for the refill. If patient does not wish to contact the pharmacy document the reason why and proceed with request.) (Agent: If yes, when and what did the pharmacy advise?)  This is the patient's preferred pharmacy:   [id number] - [place], [place] - [address] [place] [address] [place] [address] [place] [address] Phone: [phone] Fax: [phone]  Is this the correct pharmacy for this prescription? Yes If no, delete pharmacy and type the correct one.   Has the prescription been filled recently? Yes  Is the patient out of the medication? Yes  Has the patient been seen for an appointment in the last year OR does the patient have an upcoming appointment? Yes  Can we respond through MyChart? Yes  Agent: Please be advised that Rx refills may take up to 3 business days. We ask that you follow-up with your pharmacy.

## 2024-02-02 ENCOUNTER — Telehealth: Payer: Self-pay

## 2024-02-02 NOTE — Telephone Encounter (Signed)
 Copied from CRM #[id number]. Topic: Clinical - Prescription Issue >> Feb 02, 2024  2:25 PM April Kirk wrote: Medication: amphetamine -dextroamphetamine (ADDERALL XR) 25 MG 24 hr capsule Pt has been out of her medication for 2 weeks and says she called Dec 20,2025

## 2024-02-03 NOTE — Telephone Encounter (Signed)
She already has refills until February

## 2024-02-04 NOTE — Telephone Encounter (Signed)
 Called and spoke to [facility] pharmacy patient has 3 refills, patient is aware

## 2024-03-09 ENCOUNTER — Telehealth: Payer: Self-pay

## 2024-03-10 MED ORDER — AMPHETAMINE-DEXTROAMPHET ER 25 MG PO CP24: 25.0000 mg | ORAL_CAPSULE | ORAL | 0 refills | Status: AC

## 2024-03-10 NOTE — Telephone Encounter (Signed)
 Done

## 2024-03-10 NOTE — Addendum Note (Signed)
 Addended by: JOHNNY SENIOR A on: 03/10/2024 12:41 PM   Modules accepted: Orders
